# Patient Record
Sex: Female | Born: 1955 | Race: White | Hispanic: No | Marital: Single | Smoking: Never smoker
Health system: Southern US, Community
[De-identification: ages and names within clinical notes are randomized; demographics above are authoritative.]

## PROBLEM LIST (undated history)

## (undated) DIAGNOSIS — S069X9A Unspecified intracranial injury with loss of consciousness of unspecified duration, initial encounter: Secondary | ICD-10-CM

## (undated) DIAGNOSIS — E039 Hypothyroidism, unspecified: Secondary | ICD-10-CM

## (undated) DIAGNOSIS — Z9889 Other specified postprocedural states: Secondary | ICD-10-CM

## (undated) DIAGNOSIS — J45909 Unspecified asthma, uncomplicated: Secondary | ICD-10-CM

## (undated) DIAGNOSIS — S069XAA Unspecified intracranial injury with loss of consciousness status unknown, initial encounter: Secondary | ICD-10-CM

## (undated) DIAGNOSIS — S129XXA Fracture of neck, unspecified, initial encounter: Secondary | ICD-10-CM

## (undated) DIAGNOSIS — R112 Nausea with vomiting, unspecified: Secondary | ICD-10-CM

## (undated) HISTORY — PX: FOOT NEUROMA SURGERY: SHX646

## (undated) HISTORY — PX: OOPHORECTOMY: SHX86

## (undated) HISTORY — PX: FRACTURE SURGERY: SHX138

## (undated) HISTORY — PX: OTHER SURGICAL HISTORY: SHX169

---

## 2005-02-19 ENCOUNTER — Ambulatory Visit: Payer: Self-pay

## 2005-03-10 ENCOUNTER — Ambulatory Visit: Payer: Self-pay

## 2005-04-01 ENCOUNTER — Ambulatory Visit: Payer: Self-pay | Admitting: Obstetrics and Gynecology

## 2005-04-22 ENCOUNTER — Ambulatory Visit: Payer: Self-pay | Admitting: Psychiatry

## 2005-10-25 ENCOUNTER — Emergency Department: Payer: Self-pay | Admitting: Emergency Medicine

## 2006-04-20 ENCOUNTER — Ambulatory Visit: Payer: Self-pay | Admitting: Obstetrics and Gynecology

## 2007-01-07 ENCOUNTER — Ambulatory Visit: Payer: Self-pay | Admitting: Internal Medicine

## 2007-05-11 ENCOUNTER — Ambulatory Visit: Payer: Self-pay | Admitting: Obstetrics and Gynecology

## 2008-07-10 ENCOUNTER — Ambulatory Visit: Payer: Self-pay | Admitting: Internal Medicine

## 2008-07-14 ENCOUNTER — Ambulatory Visit: Payer: Self-pay | Admitting: Internal Medicine

## 2008-09-05 ENCOUNTER — Ambulatory Visit: Payer: Self-pay | Admitting: Internal Medicine

## 2009-01-18 ENCOUNTER — Ambulatory Visit: Payer: Self-pay | Admitting: Otolaryngology

## 2009-11-07 ENCOUNTER — Ambulatory Visit: Payer: Self-pay | Admitting: Internal Medicine

## 2010-01-31 ENCOUNTER — Emergency Department: Payer: Self-pay | Admitting: Emergency Medicine

## 2010-03-13 ENCOUNTER — Encounter: Payer: Self-pay | Admitting: Physical Medicine and Rehabilitation

## 2010-03-17 ENCOUNTER — Encounter: Payer: Self-pay | Admitting: Physical Medicine and Rehabilitation

## 2010-04-12 ENCOUNTER — Ambulatory Visit: Payer: Self-pay | Admitting: Physical Medicine and Rehabilitation

## 2010-04-17 ENCOUNTER — Encounter: Payer: Self-pay | Admitting: Physical Medicine and Rehabilitation

## 2010-11-22 ENCOUNTER — Ambulatory Visit: Payer: Self-pay | Admitting: Obstetrics and Gynecology

## 2011-11-24 ENCOUNTER — Ambulatory Visit: Payer: Self-pay | Admitting: Obstetrics and Gynecology

## 2012-04-08 ENCOUNTER — Ambulatory Visit: Payer: Self-pay | Admitting: Physical Medicine and Rehabilitation

## 2012-12-01 ENCOUNTER — Ambulatory Visit: Payer: Self-pay | Admitting: Internal Medicine

## 2013-04-27 ENCOUNTER — Ambulatory Visit: Payer: Self-pay | Admitting: Internal Medicine

## 2013-12-02 ENCOUNTER — Ambulatory Visit: Payer: Self-pay | Admitting: Internal Medicine

## 2014-06-08 DIAGNOSIS — E538 Deficiency of other specified B group vitamins: Secondary | ICD-10-CM | POA: Insufficient documentation

## 2014-06-08 DIAGNOSIS — F329 Major depressive disorder, single episode, unspecified: Secondary | ICD-10-CM | POA: Insufficient documentation

## 2014-06-08 DIAGNOSIS — K5190 Ulcerative colitis, unspecified, without complications: Secondary | ICD-10-CM | POA: Insufficient documentation

## 2014-06-08 DIAGNOSIS — F32A Depression, unspecified: Secondary | ICD-10-CM | POA: Insufficient documentation

## 2014-06-08 DIAGNOSIS — E039 Hypothyroidism, unspecified: Secondary | ICD-10-CM | POA: Insufficient documentation

## 2014-06-08 DIAGNOSIS — G43909 Migraine, unspecified, not intractable, without status migrainosus: Secondary | ICD-10-CM | POA: Insufficient documentation

## 2014-06-08 DIAGNOSIS — D649 Anemia, unspecified: Secondary | ICD-10-CM | POA: Insufficient documentation

## 2014-06-08 DIAGNOSIS — E559 Vitamin D deficiency, unspecified: Secondary | ICD-10-CM | POA: Insufficient documentation

## 2014-06-08 DIAGNOSIS — F419 Anxiety disorder, unspecified: Secondary | ICD-10-CM | POA: Insufficient documentation

## 2014-06-08 DIAGNOSIS — M5030 Other cervical disc degeneration, unspecified cervical region: Secondary | ICD-10-CM | POA: Insufficient documentation

## 2014-11-30 DIAGNOSIS — M810 Age-related osteoporosis without current pathological fracture: Secondary | ICD-10-CM | POA: Insufficient documentation

## 2014-12-27 DIAGNOSIS — M62838 Other muscle spasm: Secondary | ICD-10-CM | POA: Insufficient documentation

## 2014-12-27 DIAGNOSIS — M5417 Radiculopathy, lumbosacral region: Secondary | ICD-10-CM | POA: Insufficient documentation

## 2014-12-27 DIAGNOSIS — M51369 Other intervertebral disc degeneration, lumbar region without mention of lumbar back pain or lower extremity pain: Secondary | ICD-10-CM | POA: Insufficient documentation

## 2014-12-27 DIAGNOSIS — M5136 Other intervertebral disc degeneration, lumbar region: Secondary | ICD-10-CM | POA: Insufficient documentation

## 2015-03-21 ENCOUNTER — Other Ambulatory Visit: Payer: Self-pay

## 2015-03-21 DIAGNOSIS — Z1231 Encounter for screening mammogram for malignant neoplasm of breast: Secondary | ICD-10-CM

## 2015-04-05 ENCOUNTER — Ambulatory Visit: Payer: Self-pay

## 2015-04-20 ENCOUNTER — Ambulatory Visit
Admission: RE | Admit: 2015-04-20 | Discharge: 2015-04-20 | Disposition: A | Discharge status: Home / Self Care | Payer: Medicare PPO | Source: Ambulatory Visit | Attending: Physician Assistant | Admitting: Physician Assistant

## 2015-04-20 ENCOUNTER — Other Ambulatory Visit: Payer: Self-pay | Admitting: Physician Assistant

## 2015-04-20 DIAGNOSIS — R51 Headache: Principal | ICD-10-CM

## 2015-04-20 DIAGNOSIS — S0990XA Unspecified injury of head, initial encounter: Secondary | ICD-10-CM | POA: Insufficient documentation

## 2015-04-20 DIAGNOSIS — R519 Headache, unspecified: Secondary | ICD-10-CM

## 2015-05-14 ENCOUNTER — Ambulatory Visit: Payer: Medicare PPO

## 2015-05-16 ENCOUNTER — Other Ambulatory Visit: Payer: Self-pay | Admitting: Internal Medicine

## 2015-05-16 DIAGNOSIS — Z1231 Encounter for screening mammogram for malignant neoplasm of breast: Secondary | ICD-10-CM

## 2015-05-18 ENCOUNTER — Ambulatory Visit
Admission: RE | Admit: 2015-05-18 | Discharge: 2015-05-18 | Disposition: A | Discharge status: Home / Self Care | Payer: Medicare PPO | Source: Ambulatory Visit | Attending: Internal Medicine | Admitting: Internal Medicine

## 2015-05-18 ENCOUNTER — Other Ambulatory Visit: Payer: Self-pay | Admitting: Internal Medicine

## 2015-05-18 DIAGNOSIS — Z1231 Encounter for screening mammogram for malignant neoplasm of breast: Secondary | ICD-10-CM | POA: Diagnosis not present

## 2015-12-10 DIAGNOSIS — F3341 Major depressive disorder, recurrent, in partial remission: Secondary | ICD-10-CM | POA: Insufficient documentation

## 2016-05-07 ENCOUNTER — Other Ambulatory Visit: Payer: Self-pay | Admitting: Internal Medicine

## 2016-05-08 ENCOUNTER — Other Ambulatory Visit: Payer: Self-pay | Admitting: Internal Medicine

## 2016-05-08 ENCOUNTER — Other Ambulatory Visit: Payer: Self-pay | Admitting: Obstetrics and Gynecology

## 2016-05-08 DIAGNOSIS — Z1231 Encounter for screening mammogram for malignant neoplasm of breast: Secondary | ICD-10-CM

## 2016-05-26 ENCOUNTER — Ambulatory Visit: Payer: Medicare PPO

## 2016-06-16 ENCOUNTER — Other Ambulatory Visit: Payer: Self-pay | Admitting: Obstetrics and Gynecology

## 2016-06-16 ENCOUNTER — Ambulatory Visit
Admission: RE | Admit: 2016-06-16 | Discharge: 2016-06-16 | Disposition: A | Discharge status: Home / Self Care | Payer: Medicare Other | Source: Ambulatory Visit | Attending: Obstetrics and Gynecology | Admitting: Obstetrics and Gynecology

## 2016-06-16 DIAGNOSIS — Z1231 Encounter for screening mammogram for malignant neoplasm of breast: Secondary | ICD-10-CM

## 2016-12-30 ENCOUNTER — Telehealth: Payer: Self-pay | Admitting: Gastroenterology

## 2016-12-30 DIAGNOSIS — E785 Hyperlipidemia, unspecified: Secondary | ICD-10-CM | POA: Insufficient documentation

## 2016-12-30 NOTE — Telephone Encounter (Signed)
Left voice message for patient to call and schedule appointment with GI for ulcerative colitis referred by [facility].

## 2017-01-27 ENCOUNTER — Ambulatory Visit: Payer: Medicare PPO | Admitting: Gastroenterology

## 2017-01-28 ENCOUNTER — Emergency Department: Payer: Medicare Other

## 2017-01-28 ENCOUNTER — Encounter: Payer: Self-pay | Admitting: Emergency Medicine

## 2017-01-28 ENCOUNTER — Emergency Department
Admission: EM | Admit: 2017-01-28 | Discharge: 2017-01-28 | Disposition: A | Discharge status: Home / Self Care | Payer: Medicare Other | Attending: Emergency Medicine | Admitting: Emergency Medicine

## 2017-01-28 DIAGNOSIS — S060X0A Concussion without loss of consciousness, initial encounter: Secondary | ICD-10-CM | POA: Diagnosis not present

## 2017-01-28 DIAGNOSIS — S0990XA Unspecified injury of head, initial encounter: Secondary | ICD-10-CM | POA: Diagnosis present

## 2017-01-28 DIAGNOSIS — W1809XA Striking against other object with subsequent fall, initial encounter: Secondary | ICD-10-CM | POA: Insufficient documentation

## 2017-01-28 DIAGNOSIS — Y9301 Activity, walking, marching and hiking: Secondary | ICD-10-CM | POA: Insufficient documentation

## 2017-01-28 DIAGNOSIS — Y929 Unspecified place or not applicable: Secondary | ICD-10-CM | POA: Insufficient documentation

## 2017-01-28 DIAGNOSIS — Y999 Unspecified external cause status: Secondary | ICD-10-CM | POA: Diagnosis not present

## 2017-01-28 DIAGNOSIS — J45909 Unspecified asthma, uncomplicated: Secondary | ICD-10-CM | POA: Insufficient documentation

## 2017-01-28 HISTORY — DX: Fracture of neck, unspecified, initial encounter: S12.9XXA

## 2017-01-28 HISTORY — DX: Unspecified asthma, uncomplicated: J45.909

## 2017-01-28 HISTORY — DX: Unspecified intracranial injury with loss of consciousness status unknown, initial encounter: S06.9XAA

## 2017-01-28 HISTORY — DX: Unspecified intracranial injury with loss of consciousness of unspecified duration, initial encounter: S06.9X9A

## 2017-01-28 MED ORDER — ACETAMINOPHEN 325 MG PO TABS
650.0000 mg | ORAL_TABLET | Freq: Once | ORAL | Status: AC
  Administered 2017-01-28: 650 mg via ORAL
  Filled 2017-01-28: qty 2
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
## 2017-01-28 NOTE — ED Triage Notes (Signed)
Says fell night before last while walking her dogs and hit head. Says head and neck pain.

## 2017-02-19 ENCOUNTER — Ambulatory Visit: Payer: Medicare Other | Admitting: Gastroenterology

## 2017-02-19 ENCOUNTER — Other Ambulatory Visit: Payer: Self-pay

## 2017-03-23 ENCOUNTER — Ambulatory Visit: Payer: Medicare Other | Admitting: Gastroenterology

## 2017-04-23 ENCOUNTER — Ambulatory Visit (INDEPENDENT_AMBULATORY_CARE_PROVIDER_SITE_OTHER): Payer: Medicare Other | Admitting: Gastroenterology

## 2017-04-23 ENCOUNTER — Other Ambulatory Visit: Payer: Self-pay

## 2017-04-23 ENCOUNTER — Encounter: Payer: Self-pay | Admitting: Gastroenterology

## 2017-04-23 VITALS — BP 134/80 | HR 97 | Temp 98.2°F | Ht 63.0 in | Wt 154.0 lb

## 2017-04-23 DIAGNOSIS — K51819 Other ulcerative colitis with unspecified complications: Secondary | ICD-10-CM

## 2017-04-23 DIAGNOSIS — K51919 Ulcerative colitis, unspecified with unspecified complications: Secondary | ICD-10-CM
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
## 2017-04-24 ENCOUNTER — Telehealth: Payer: Self-pay | Admitting: Gastroenterology

## 2017-04-24 NOTE — Telephone Encounter (Signed)
04/24/17 [employer] website NO prior auth required for Colonoscopy [id number] / K51.90 Decision ID#: [id number].

## 2017-06-29 ENCOUNTER — Telehealth: Payer: Self-pay | Admitting: Gastroenterology

## 2017-06-29 ENCOUNTER — Encounter: Payer: Self-pay | Admitting: *Deleted

## 2017-06-29 NOTE — Telephone Encounter (Signed)
Patient left a voice message that she was out of town and forgot about her colonoscopy tomorrow. Please call her at [phone] to reschedule

## 2017-06-30 ENCOUNTER — Encounter: Admission: RE | Payer: Self-pay | Source: Ambulatory Visit

## 2017-06-30 ENCOUNTER — Ambulatory Visit: Admission: RE | Admit: 2017-06-30 | Payer: Medicare Other | Source: Ambulatory Visit | Admitting: Gastroenterology

## 2017-06-30 ENCOUNTER — Other Ambulatory Visit: Payer: Self-pay | Admitting: Internal Medicine

## 2017-06-30 DIAGNOSIS — Z1231 Encounter for screening mammogram for malignant neoplasm of breast: Secondary | ICD-10-CM

## 2017-06-30 SURGERY — COLONOSCOPY WITH PROPOFOL
Anesthesia: General

## 2017-06-30 NOTE — Telephone Encounter (Signed)
Left vm for pt to return my call to reschedule colonoscopy. Contacted [facility] to cancel procedure.

## 2017-08-17 ENCOUNTER — Ambulatory Visit
Admission: RE | Admit: 2017-08-17 | Discharge: 2017-08-17 | Disposition: A | Discharge status: Home / Self Care | Payer: Medicare Other | Source: Ambulatory Visit | Attending: Internal Medicine | Admitting: Internal Medicine

## 2017-08-17 DIAGNOSIS — Z1231 Encounter for screening mammogram for malignant neoplasm of breast: Secondary | ICD-10-CM | POA: Insufficient documentation

## 2017-08-26 ENCOUNTER — Telehealth: Payer: Self-pay | Admitting: Gastroenterology

## 2017-08-26 ENCOUNTER — Other Ambulatory Visit: Payer: Self-pay

## 2017-08-26 DIAGNOSIS — K51919 Ulcerative colitis, unspecified with unspecified complications: Secondary | ICD-10-CM

## 2017-08-26 NOTE — Telephone Encounter (Signed)
Pt has been scheduled for a colonoscopy at [facility] on 09/10/17. Instructs/rx mailed.

## 2017-08-26 NOTE — Telephone Encounter (Signed)
Patient called and would like a returned call today. She stated she wasn't called back after you called her last time.  If she doesn't answer at home please call her cell [phone]

## 2017-09-07 ENCOUNTER — Encounter: Payer: Self-pay | Admitting: *Deleted

## 2017-09-10 ENCOUNTER — Ambulatory Visit: Payer: Medicare Other | Admitting: Anesthesiology

## 2017-09-10 ENCOUNTER — Encounter
Admission: RE | Disposition: A | Discharge status: Home / Self Care | Payer: Self-pay | Source: Ambulatory Visit | Attending: Gastroenterology

## 2017-09-10 ENCOUNTER — Ambulatory Visit
Admission: RE | Admit: 2017-09-10 | Discharge: 2017-09-10 | Disposition: A | Discharge status: Home / Self Care | Payer: Medicare Other | Source: Ambulatory Visit | Attending: Gastroenterology | Admitting: Gastroenterology

## 2017-09-10 DIAGNOSIS — K5190 Ulcerative colitis, unspecified, without complications: Secondary | ICD-10-CM | POA: Insufficient documentation

## 2017-09-10 DIAGNOSIS — Z79899 Other long term (current) drug therapy: Secondary | ICD-10-CM | POA: Insufficient documentation

## 2017-09-10 DIAGNOSIS — K279 Peptic ulcer, site unspecified, unspecified as acute or chronic, without hemorrhage or perforation: Secondary | ICD-10-CM | POA: Insufficient documentation

## 2017-09-10 DIAGNOSIS — K51919 Ulcerative colitis, unspecified with unspecified complications: Secondary | ICD-10-CM

## 2017-09-10 DIAGNOSIS — J45909 Unspecified asthma, uncomplicated: Secondary | ICD-10-CM | POA: Diagnosis not present

## 2017-09-10 DIAGNOSIS — Z1211 Encounter for screening for malignant neoplasm of colon: Secondary | ICD-10-CM | POA: Diagnosis not present

## 2017-09-10 HISTORY — DX: Other specified postprocedural states: Z98.890

## 2017-09-10 HISTORY — PX: COLONOSCOPY WITH PROPOFOL: SHX5780

## 2017-09-10 HISTORY — DX: Nausea with vomiting, unspecified: R11.2

## 2017-09-10 SURGERY — COLONOSCOPY WITH PROPOFOL
Anesthesia: General | Wound class: Contaminated

## 2017-09-10 MED ORDER — LACTATED RINGERS IV SOLN
INTRAVENOUS | Status: DC
  Administered 2017-09-10: 08:00:00 via INTRAVENOUS

## 2017-09-10 MED ORDER — STERILE WATER FOR IRRIGATION IR SOLN
Status: DC | PRN
  Administered 2017-09-10: 08:00:00

## 2017-09-10 MED ORDER — PROPOFOL 10 MG/ML IV BOLUS
INTRAVENOUS | Status: DC | PRN
  Administered 2017-09-10: 20 mg via INTRAVENOUS
  Administered 2017-09-10: 80 mg via INTRAVENOUS
  Administered 2017-09-10 (×7): 20 mg via INTRAVENOUS

## 2017-09-10 MED ORDER — LIDOCAINE HCL (CARDIAC) 20 MG/ML IV SOLN
INTRAVENOUS | Status: DC | PRN
  Administered 2017-09-10: 50 mg via INTRAVENOUS

## 2017-09-10 MED ORDER — SODIUM CHLORIDE 0.9 % IV SOLN: INTRAVENOUS | Status: DC

## 2017-09-10 SURGICAL SUPPLY — 23 items

## 2017-09-10 NOTE — Transfer of Care (Signed)
Immediate Anesthesia Transfer of Care Note  Patient: Cristina Duncan  Procedure(s) Performed: COLONOSCOPY WITH PROPOFOL (N/A )  Patient Location: PACU  Anesthesia Type: General  Level of Consciousness: awake, alert  and patient cooperative  Airway and Oxygen Therapy: Patient Spontanous Breathing and Patient connected to supplemental oxygen  Post-op Assessment: Post-op Vital signs reviewed, Patient's Cardiovascular Status Stable, Respiratory Function Stable, Patent Airway and No signs of Nausea or vomiting  Post-op Vital Signs: Reviewed and stable  Complications: No apparent anesthesia complications
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
## 2017-09-10 NOTE — Anesthesia Procedure Notes (Signed)
Performed by: Mayme Genta Pre-anesthesia Checklist: Patient identified, Emergency Drugs available, Suction available, Timeout performed and Patient being monitored Patient Re-evaluated:Patient Re-evaluated prior to induction Oxygen Delivery Method: Nasal cannula Placement Confirmation: positive ETCO2

## 2017-09-10 NOTE — Op Note (Signed)
[facility] Patient Name: Cristina Duncan Procedure Date: 09/10/2017 8:16 AM MRN: [id number] Account #: [id number] Date of Birth: September 30, 1956 Admit Type: Outpatient Age: 61 Room: [facility] OR ROOM 01 Gender: Female Note Status: Finalized Procedure:            Colonoscopy Indications:          High risk colon cancer surveillance: Ulcerative colitis Providers:            Lucilla Lame MD, MD Referring MD:         Leonie Douglas. Doy Hutching, MD (Referring MD) Medicines:            Propofol per Anesthesia Complications:        No immediate complications. Procedure:            Pre-Anesthesia Assessment:                       - Prior to the procedure, a History and Physical was                        performed, and patient medications and allergies were                        reviewed. The patient's tolerance of previous                        anesthesia was also reviewed. The risks and benefits of                        the procedure and the sedation options and risks were                        discussed with the patient. All questions were                        answered, and informed consent was obtained. Prior                        Anticoagulants: The patient has taken no previous                        anticoagulant or antiplatelet agents. ASA Grade                        Assessment: II - A patient with mild systemic disease.                        After reviewing the risks and benefits, the patient was                        deemed in satisfactory condition to undergo the                        procedure.                       After obtaining informed consent, the colonoscope was                        passed under direct vision. Throughout the procedure,  the patient's blood pressure, pulse, and oxygen                        saturations were monitored continuously. The [place] [id number]) was  introduced through the                        anus and advanced to the the cecum, identified by                        appendiceal orifice and ileocecal valve. The                        colonoscopy was performed without difficulty. The                        patient tolerated the procedure well. The quality of                        the bowel preparation was excellent. Findings:      The perianal and digital rectal examinations were normal.      The entire examined colon appeared normal.      Four biopsies were taken every 10 cm with a cold forceps from the entire       colon for ulcerative colitis surveillance. These biopsy specimens from       the entire colon were sent to Pathology. Impression:           - The entire examined colon is normal.                       - Biopsies for surveillance were taken from the entire                        colon. Recommendation:       - Discharge patient to home.                       - Resume previous diet.                       - Continue present medications.                       - Await pathology results.                       - Repeat colonoscopy in 2 years for surveillance. Procedure Code(s):    --- Professional ---                       [phone], Colonoscopy, flexible; with biopsy, single or                        multiple Diagnosis Code(s):    --- Professional ---                       K51.90, Ulcerative colitis, unspecified, without  complications CPT copyright 2016 American Medical Association. All rights reserved. The codes documented in this report are preliminary and upon coder review may  be revised to meet current compliance requirements. Lucilla Lame MD, MD 09/10/2017 8:50:29 AM This report has been signed electronically. Number of Addenda: 0 Note Initiated On: 09/10/2017 8:16 AM Scope Withdrawal Time: 0 hours 10 minutes 0 seconds  Total Procedure Duration: 0 hours 17 minutes 52 seconds       [facility]

## 2017-09-10 NOTE — Discharge Instructions (Signed)
General Anesthesia, Adult, Care After °These instructions provide you with information about caring for yourself after your procedure. Your health care provider may also give you more specific instructions. Your treatment has been planned according to current medical practices, but problems sometimes occur. Call your health care provider if you have any problems or questions after your procedure. °What can I expect after the procedure? °After the procedure, it is common to have: °· Vomiting. °· A sore throat. °· Mental slowness. ° °It is common to feel: °· Nauseous. °· Cold or shivery. °· Sleepy. °· Tired. °· Sore or achy, even in parts of your body where you did not have surgery. ° °Follow these instructions at home: °For at least 24 hours after the procedure: °· Do not: °? Participate in activities where you could fall or become injured. °? Drive. °? Use heavy machinery. °? Drink alcohol. °? Take sleeping pills or medicines that cause drowsiness. °? Make important decisions or sign legal documents. °? Take care of children on your own. °· Rest. °Eating and drinking °· If you vomit, drink water, juice, or soup when you can drink without vomiting. °· Drink enough fluid to keep your urine clear or pale yellow. °· Make sure you have little or no nausea before eating solid foods. °· Follow the diet recommended by your health care provider. °General instructions °· Have a responsible adult stay with you until you are awake and alert. °· Return to your normal activities as told by your health care provider. Ask your health care provider what activities are safe for you. °· Take over-the-counter and prescription medicines only as told by your health care provider. °· If you smoke, do not smoke without supervision. °· Keep all follow-up visits as told by your health care provider. This is important. °Contact a health care provider if: °· You continue to have nausea or vomiting at home, and medicines are not helpful. °· You  cannot drink fluids or start eating again. °· You cannot urinate after 8-12 hours. °· You develop a skin rash. °· You have fever. °· You have increasing redness at the site of your procedure. °Get help right away if: °· You have difficulty breathing. °· You have chest pain. °· You have unexpected bleeding. °· You feel that you are having a life-threatening or urgent problem. °This information is not intended to replace advice given to you by your health care provider. Make sure you discuss any questions you have with your health care provider. °Document Released: 02/09/2001 Document Revised: 04/07/2016 Document Reviewed: 10/18/2015 °Elsevier Interactive Patient Education © 2018 [employer] ° °

## 2017-09-10 NOTE — Anesthesia Preprocedure Evaluation (Signed)
Anesthesia Evaluation  Patient identified by MRN, date of birth, ID band Patient awake    Reviewed: Allergy & Precautions, H&P , NPO status , Patient's Chart, lab work & pertinent test results  History of Anesthesia Complications (+) PONV and history of anesthetic complications  Airway Mallampati: II  TM Distance: >3 FB Neck ROM: full    Dental no notable dental hx.    Pulmonary asthma ,    Pulmonary exam normal        Cardiovascular Normal cardiovascular exam     Neuro/Psych    GI/Hepatic Neg liver ROS, PUD,   Endo/Other  negative endocrine ROS  Renal/GU negative Renal ROS     Musculoskeletal   Abdominal   Peds  Hematology negative hematology ROS (+)   Anesthesia Other Findings   Reproductive/Obstetrics                             Anesthesia Physical Anesthesia Plan  ASA: II  Anesthesia Plan: General   Post-op Pain Management:    Induction:   PONV Risk Score and Plan:   Airway Management Planned:   Additional Equipment:   Intra-op Plan:   Post-operative Plan:   Informed Consent: I have reviewed the patients History and Physical, chart, labs and discussed the procedure including the risks, benefits and alternatives for the proposed anesthesia with the patient or authorized representative who has indicated his/her understanding and acceptance.     Plan Discussed with:   Anesthesia Plan Comments:         Anesthesia Quick Evaluation

## 2017-09-10 NOTE — Anesthesia Postprocedure Evaluation (Signed)
Anesthesia Post Note  Patient: Cristina Duncan  Procedure(s) Performed: COLONOSCOPY WITH PROPOFOL (N/A )  Patient location during evaluation: PACU Anesthesia Type: General Level of consciousness: awake and alert Pain management: pain level controlled Vital Signs Assessment: post-procedure vital signs reviewed and stable Cardiovascular status: blood pressure returned to baseline Postop Assessment: no headache Anesthetic complications: no    Jaci Standard, III,  Maree Ainley D

## 2017-09-11 ENCOUNTER — Encounter: Payer: Self-pay | Admitting: Gastroenterology

## 2017-09-25 ENCOUNTER — Telehealth: Payer: Self-pay

## 2017-09-25 NOTE — Telephone Encounter (Signed)
Pt notified of colonoscopy results.

## 2017-09-25 NOTE — Telephone Encounter (Signed)
-----   Message from Lucilla Lame, MD sent at 09/14/2017  8:01 AM EDT ----- Let the patient know the biopsies showed inactive ulcerative colitis without any precancerous tissue. Next colonoscopy in 2 years.

## 2017-10-15 DIAGNOSIS — M159 Polyosteoarthritis, unspecified: Secondary | ICD-10-CM | POA: Insufficient documentation

## 2017-11-16 ENCOUNTER — Ambulatory Visit: Payer: Medicare Other | Admitting: Occupational Therapy

## 2017-11-18 ENCOUNTER — Ambulatory Visit: Payer: Medicare Other | Admitting: Occupational Therapy

## 2017-11-25 ENCOUNTER — Ambulatory Visit: Payer: Medicare Other | Admitting: Occupational Therapy

## 2017-11-27 ENCOUNTER — Other Ambulatory Visit: Payer: Self-pay | Admitting: Physical Medicine and Rehabilitation

## 2017-11-27 DIAGNOSIS — M5416 Radiculopathy, lumbar region: Secondary | ICD-10-CM

## 2017-12-03 ENCOUNTER — Other Ambulatory Visit: Payer: Self-pay | Admitting: Physical Medicine and Rehabilitation

## 2017-12-03 ENCOUNTER — Ambulatory Visit
Admission: RE | Admit: 2017-12-03 | Discharge: 2017-12-03 | Disposition: A | Discharge status: Home / Self Care | Payer: Medicare Other | Source: Ambulatory Visit | Attending: Physical Medicine and Rehabilitation | Admitting: Physical Medicine and Rehabilitation

## 2017-12-03 DIAGNOSIS — M5126 Other intervertebral disc displacement, lumbar region: Secondary | ICD-10-CM | POA: Insufficient documentation

## 2017-12-03 DIAGNOSIS — M5416 Radiculopathy, lumbar region: Secondary | ICD-10-CM

## 2017-12-03 DIAGNOSIS — M5136 Other intervertebral disc degeneration, lumbar region: Secondary | ICD-10-CM | POA: Insufficient documentation

## 2017-12-03 DIAGNOSIS — M48061 Spinal stenosis, lumbar region without neurogenic claudication: Secondary | ICD-10-CM | POA: Insufficient documentation

## 2017-12-08 ENCOUNTER — Telehealth: Payer: Self-pay | Admitting: Gastroenterology

## 2017-12-08 NOTE — Telephone Encounter (Signed)
Patient LVM for you to call her regarding stomach issues.

## 2017-12-10 NOTE — Telephone Encounter (Signed)
Spoke with pt and scheduled her for an office appt as she is still having issues with her stomach.

## 2017-12-21 ENCOUNTER — Other Ambulatory Visit: Payer: Self-pay | Admitting: Internal Medicine

## 2017-12-21 ENCOUNTER — Ambulatory Visit: Payer: Medicare Other | Admitting: Gastroenterology

## 2017-12-21 DIAGNOSIS — M5416 Radiculopathy, lumbar region: Secondary | ICD-10-CM

## 2018-01-12 ENCOUNTER — Ambulatory Visit: Admission: RE | Admit: 2018-01-12 | Payer: Medicare Other | Source: Ambulatory Visit

## 2018-01-12 ENCOUNTER — Other Ambulatory Visit: Payer: Self-pay | Admitting: Internal Medicine

## 2018-01-12 DIAGNOSIS — M5416 Radiculopathy, lumbar region: Secondary | ICD-10-CM

## 2018-08-17 ENCOUNTER — Other Ambulatory Visit: Payer: Self-pay | Admitting: Obstetrics and Gynecology

## 2018-08-17 DIAGNOSIS — Z1231 Encounter for screening mammogram for malignant neoplasm of breast: Secondary | ICD-10-CM

## 2018-08-31 ENCOUNTER — Ambulatory Visit
Admission: RE | Admit: 2018-08-31 | Discharge: 2018-08-31 | Disposition: A | Discharge status: Home / Self Care | Payer: Medicare Other | Source: Ambulatory Visit | Attending: Obstetrics and Gynecology | Admitting: Obstetrics and Gynecology

## 2018-08-31 DIAGNOSIS — Z1231 Encounter for screening mammogram for malignant neoplasm of breast: Secondary | ICD-10-CM | POA: Diagnosis not present

## 2019-01-26 ENCOUNTER — Other Ambulatory Visit: Payer: Self-pay | Admitting: Physician Assistant

## 2019-01-26 ENCOUNTER — Other Ambulatory Visit: Payer: Self-pay

## 2019-01-26 ENCOUNTER — Ambulatory Visit
Admission: RE | Admit: 2019-01-26 | Discharge: 2019-01-26 | Disposition: A | Discharge status: Home / Self Care | Payer: Medicare Other | Source: Ambulatory Visit | Attending: Physician Assistant | Admitting: Physician Assistant

## 2019-01-26 ENCOUNTER — Ambulatory Visit: Admission: RE | Admit: 2019-01-26 | Payer: Medicare Other | Source: Ambulatory Visit | Admitting: *Deleted

## 2019-01-26 DIAGNOSIS — M25562 Pain in left knee: Secondary | ICD-10-CM | POA: Diagnosis not present

## 2019-01-31 ENCOUNTER — Ambulatory Visit: Payer: Medicare Other

## 2019-07-22 ENCOUNTER — Other Ambulatory Visit: Payer: Self-pay | Admitting: Family Medicine

## 2019-07-22 ENCOUNTER — Other Ambulatory Visit: Payer: Self-pay | Admitting: Physical Medicine and Rehabilitation

## 2019-07-22 DIAGNOSIS — M5412 Radiculopathy, cervical region: Secondary | ICD-10-CM

## 2019-08-05 ENCOUNTER — Ambulatory Visit
Admission: RE | Admit: 2019-08-05 | Discharge: 2019-08-05 | Disposition: A | Discharge status: Home / Self Care | Payer: Medicare Other | Source: Ambulatory Visit | Attending: Physical Medicine and Rehabilitation | Admitting: Physical Medicine and Rehabilitation

## 2019-08-05 ENCOUNTER — Other Ambulatory Visit: Payer: Self-pay

## 2019-08-05 DIAGNOSIS — M5412 Radiculopathy, cervical region: Secondary | ICD-10-CM | POA: Diagnosis not present

## 2019-08-08 ENCOUNTER — Telehealth: Payer: Self-pay | Admitting: Gastroenterology

## 2019-08-08 NOTE — Telephone Encounter (Signed)
Pt left vm she received a recall letter to schedule a colonoscopy with Dr.Wohl

## 2019-08-10 NOTE — Telephone Encounter (Signed)
You are correct to use ulcerative colitis. Screenings are every 10 years and her last one was 2 years ago. That was his recommendation at the bottom of her last procedure.

## 2019-08-11 ENCOUNTER — Telehealth: Payer: Self-pay

## 2019-08-11 NOTE — Telephone Encounter (Signed)
Contacted patient in regards to scheduling her colonoscopy repeat with Dr. Allen Norris for Ulcerative Colitis.  Pt states that she has been experiencing  Bloating and Gassiness after her meals.  I advised her to avoid dairy products, leafy vegetables as these can contribute to her current symptoms.  She also states that she has noticed mucus in her stools recently. She is currently scheduled for 09/14/19 to see Dr. Allen Norris for constipation.  I informed her that I would discuss her current symptoms with Ginger to see if we can move her appt up to a sooner date, and call her back in the am.  She appreciated this very much, and will await for a call back.  Thanks [employer]

## 2019-08-25 ENCOUNTER — Other Ambulatory Visit: Payer: Self-pay

## 2019-08-25 ENCOUNTER — Ambulatory Visit (INDEPENDENT_AMBULATORY_CARE_PROVIDER_SITE_OTHER): Payer: Medicare Other | Admitting: Gastroenterology

## 2019-08-25 ENCOUNTER — Encounter: Payer: Self-pay | Admitting: Gastroenterology

## 2019-08-25 VITALS — BP 108/71 | HR 94 | Temp 98.1°F | Ht 63.0 in | Wt 143.8 lb

## 2019-08-25 DIAGNOSIS — K5904 Chronic idiopathic constipation: Secondary | ICD-10-CM | POA: Diagnosis not present

## 2019-08-25 DIAGNOSIS — K5180 Other ulcerative colitis without complications: Secondary | ICD-10-CM

## 2019-08-25 NOTE — Progress Notes (Signed)
Primary Care Physician: Idelle Crouch, MD  Primary Gastroenterologist:  Dr. Lucilla Lame  Chief Complaint  Patient presents with   Constipation    HPI: Cristina Duncan is a 63 y.o. female here with a history of ulcerative colitis.  The patient's last colonoscopy was 2 years ago with inactive chronic colitis seen.  Patient states that shortly after having a colonoscopy 2 years ago she started to develop constipation.  The patient has been constipated since then and reports that her stools are typically hard and she has been having trouble passing them.  She also states that she has been taking a large amount of Gas-X to try to help with the bloating and abdominal discomfort from her constipation.  There is no report of any unexplained weight loss fevers chills nausea or vomiting.  The patient also denies any black stools or bloody stools.  Current Outpatient Medications  Medication Sig Dispense Refill   AIMOVIG 140 MG/ML SOAJ      buPROPion (WELLBUTRIN XL) 150 MG 24 hr tablet      Cholecalciferol (VITAMIN D3) 5000 units TABS Take by mouth.     clonazePAM (KLONOPIN) 2 MG tablet TAKE 1 TABLET BY MOUTH 3 TIMES DAILY     cyanocobalamin (,VITAMIN B-12,) 1000 MCG/ML injection Inject into the muscle.     cyclobenzaprine (FLEXERIL) 5 MG tablet TAKE 1 TABLET 3 TIMES DAILY     diazepam (VALIUM) 5 MG tablet      DULoxetine (CYMBALTA) 30 MG capsule Take by mouth.     DULoxetine (CYMBALTA) 60 MG capsule Take by mouth.     estradiol (ESTRACE) 1 MG tablet TAKE 1 TABLET BY MOUTH DAILY     Fremanezumab-vfrm (AJOVY) 225 MG/1.5ML SOSY Inject into the skin.     gabapentin (NEURONTIN) 100 MG capsule      lansoprazole (PREVACID) 30 MG capsule      meloxicam (MOBIC) 7.5 MG tablet Take by mouth.     progesterone (PROMETRIUM) 200 MG capsule TAKE ONE CAPSULE BY MOUTH ONCE DAILY AT BEDTIME     RESTASIS MULTIDOSE 0.05 % ophthalmic emulsion      SUMAtriptan (IMITREX) 100 MG tablet       temazepam (RESTORIL) 15 MG capsule TAKE ONE CASPULE BY MOUTH AT BEDTIME AS NEEDED FOR SLEEP     tiZANidine (ZANAFLEX) 4 MG tablet      traMADol (ULTRAM) 50 MG tablet Take by mouth.     triamcinolone cream (KENALOG) 0.5 %      vitamin B-12 (CYANOCOBALAMIN) 1000 MCG tablet Take by mouth.     fluticasone (FLONASE) 50 MCG/ACT nasal spray Place into the nose.     levothyroxine (SYNTHROID, LEVOTHROID) 75 MCG tablet Take by mouth.     No current facility-administered medications for this visit.     Allergies as of 08/25/2019 - Review Complete 09/10/2017  Allergen Reaction Noted   Clindamycin hcl  05/01/2014   Oxycodone Itching 01/28/2017   Penicillin v potassium Itching 05/01/2014   Prednisone Hives 05/01/2014   Oxycodone-acetaminophen Itching and Rash 05/01/2014    ROS:  General: Negative for anorexia, weight loss, fever, chills, fatigue, weakness. ENT: Negative for hoarseness, difficulty swallowing , nasal congestion. CV: Negative for chest pain, angina, palpitations, dyspnea on exertion, peripheral edema.  Respiratory: Negative for dyspnea at rest, dyspnea on exertion, cough, sputum, wheezing.  GI: See history of present illness. GU:  Negative for dysuria, hematuria, urinary incontinence, urinary frequency, nocturnal urination.  Endo: Negative for unusual weight change.  Physical Examination:   BP 108/71    Pulse 94    Temp 98.1 F (36.7 C) (Temporal)    Ht 5' 3"  (1.6 m)    Wt 143 lb 12.8 oz (65.2 kg)    BMI 25.47 kg/m   General: Well-nourished, well-developed in no acute distress.  Eyes: No icterus. Conjunctivae pink. Mouth: Oropharyngeal mucosa moist and pink , no lesions erythema or exudate. Lungs: Clear to auscultation bilaterally. Non-labored. Heart: Regular rate and rhythm, no murmurs rubs or gallops.  Abdomen: Bowel sounds are normal, nontender, nondistended, no hepatosplenomegaly or masses, no abdominal bruits or hernia , no rebound or guarding.     Extremities: No lower extremity edema. No clubbing or deformities. Neuro: Alert and oriented x 3.  Grossly intact. Skin: Warm and dry, no jaundice.   Psych: Alert and cooperative, normal mood and affect.  Labs:    Imaging Studies: Mr Cervical Spine Wo Contrast  Result Date: 08/05/2019 CLINICAL DATA:  Right-sided neck pain and right shoulder pain with pain down the right arm since a fall in July 2020. EXAM: MRI CERVICAL SPINE WITHOUT CONTRAST TECHNIQUE: Multiplanar, multisequence MR imaging of the cervical spine was performed. No intravenous contrast was administered. COMPARISON:  MRI dated 03/27/2010 and CT scan of the cervical spine dated 01/28/2017 FINDINGS: Alignment: Chronic slight anterolisthesis of C4 on C5, 2 mm. Reversal of the midcervical lordosis, chronic. Vertebrae: No acute abnormalities. Chronic deformity of the left lateral mass of C4 due to facet arthritis. Cord: Normal signal and morphology. Posterior Fossa, vertebral arteries, paraspinal tissues: Negative. Disc levels: C2-3: No significant abnormality. C3-4: Tiny disc bulge central and to the left with accompanying osteophytes. Severe left facet arthritis. Moderate to severe left foraminal stenosis. C4-5: 2 mm anterolisthesis. Disc space narrowing. Small broad-based disc osteophyte complex. Moderately severe left facet arthritis. No foraminal stenosis. C5-6: Chronic disc space narrowing. Small broad-based endplate osteophytes with severe right foraminal stenosis. Slight left foraminal stenosis. C6-7: Disc space narrowing. Broad-based endplate osteophytes narrow the AP dimension of the spinal canal without spinal cord compression. Moderate to severe bilateral foraminal stenosis. C7-T1: Negative. IMPRESSION: 1. 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 2. Moderately severe left facet arthritis at C4-5. 3. Severe right foraminal stenosis at C5-6. 4. Moderate to severe bilateral foraminal stenosis  at C6-7. Electronically Signed   By: Lorriane Shire M.D.   On: 08/05/2019 15:04    Assessment and Plan:   Cristina Duncan is a 63 y.o. y/o female who comes in today for bloating abdominal pain and constipation.  The patient does have a history of ulcerative colitis and is in need of a colonoscopy for surveillance.  The patient takes Dulcolax when she gets very constipated.  The patient has been told to start MiraLAX every day and titrated to a bowel movement every 1 to 2 days.  She has been told that if it does not improve she should add Citrucel.  If that does not work to make her bowels more regular the patient has been told to contact my office and she may need to be started on Linzess.  The patient has also been told that there may be an overlap between her IBd and having IBS.  The patient has been explained the plan and agrees with it.  This visit consisted of 25 minutes face to face contact with myself and at least 50% of this time was spent in counseling and education regarding diagnosis, treatment options, medication management,  risks and benefits of treatment.   Lucilla Lame, MD. Marval Regal    Note: This dictation was prepared with Dragon dictation along with smaller phrase technology. Any transcriptional errors that result from this process are unintentional.

## 2019-08-26 ENCOUNTER — Other Ambulatory Visit: Payer: Self-pay

## 2019-08-26 DIAGNOSIS — K5180 Other ulcerative colitis without complications: Secondary | ICD-10-CM

## 2019-08-26 DIAGNOSIS — K5904 Chronic idiopathic constipation: Secondary | ICD-10-CM

## 2019-08-29 ENCOUNTER — Other Ambulatory Visit: Payer: Self-pay

## 2019-08-29 ENCOUNTER — Encounter: Payer: Self-pay | Admitting: *Deleted

## 2019-08-29 ENCOUNTER — Other Ambulatory Visit: Admission: RE | Admit: 2019-08-29 | Payer: Medicare Other | Source: Ambulatory Visit

## 2019-09-06 ENCOUNTER — Other Ambulatory Visit: Payer: Self-pay

## 2019-09-06 ENCOUNTER — Other Ambulatory Visit
Admission: RE | Admit: 2019-09-06 | Discharge: 2019-09-06 | Disposition: A | Discharge status: Home / Self Care | Payer: Medicare Other | Source: Ambulatory Visit | Attending: Gastroenterology | Admitting: Gastroenterology

## 2019-09-06 DIAGNOSIS — Z20828 Contact with and (suspected) exposure to other viral communicable diseases: Secondary | ICD-10-CM | POA: Insufficient documentation

## 2019-09-06 DIAGNOSIS — Z01812 Encounter for preprocedural laboratory examination: Secondary | ICD-10-CM | POA: Diagnosis present

## 2019-09-06 LAB — SARS CORONAVIRUS 2 (TAT 6-24 HRS): SARS Coronavirus 2: NEGATIVE
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
## 2019-09-08 NOTE — Discharge Instructions (Signed)

## 2019-09-09 ENCOUNTER — Ambulatory Visit: Payer: Medicare Other | Admitting: Anesthesiology

## 2019-09-09 ENCOUNTER — Other Ambulatory Visit: Payer: Self-pay

## 2019-09-09 ENCOUNTER — Encounter
Admission: RE | Disposition: A | Discharge status: Home / Self Care | Payer: Self-pay | Source: Home / Self Care | Attending: Gastroenterology

## 2019-09-09 ENCOUNTER — Ambulatory Visit
Admission: RE | Admit: 2019-09-09 | Discharge: 2019-09-09 | Disposition: A | Discharge status: Home / Self Care | Payer: Medicare Other | Attending: Gastroenterology | Admitting: Gastroenterology

## 2019-09-09 DIAGNOSIS — Z8719 Personal history of other diseases of the digestive system: Secondary | ICD-10-CM | POA: Diagnosis not present

## 2019-09-09 DIAGNOSIS — F329 Major depressive disorder, single episode, unspecified: Secondary | ICD-10-CM | POA: Insufficient documentation

## 2019-09-09 DIAGNOSIS — K5904 Chronic idiopathic constipation: Secondary | ICD-10-CM

## 2019-09-09 DIAGNOSIS — K5100 Ulcerative (chronic) pancolitis without complications: Secondary | ICD-10-CM | POA: Diagnosis not present

## 2019-09-09 DIAGNOSIS — E039 Hypothyroidism, unspecified: Secondary | ICD-10-CM | POA: Diagnosis not present

## 2019-09-09 DIAGNOSIS — Z1211 Encounter for screening for malignant neoplasm of colon: Secondary | ICD-10-CM | POA: Diagnosis not present

## 2019-09-09 DIAGNOSIS — Z885 Allergy status to narcotic agent status: Secondary | ICD-10-CM | POA: Insufficient documentation

## 2019-09-09 DIAGNOSIS — Z8711 Personal history of peptic ulcer disease: Secondary | ICD-10-CM | POA: Insufficient documentation

## 2019-09-09 DIAGNOSIS — K5180 Other ulcerative colitis without complications: Secondary | ICD-10-CM

## 2019-09-09 DIAGNOSIS — F419 Anxiety disorder, unspecified: Secondary | ICD-10-CM | POA: Insufficient documentation

## 2019-09-09 DIAGNOSIS — Z803 Family history of malignant neoplasm of breast: Secondary | ICD-10-CM | POA: Insufficient documentation

## 2019-09-09 DIAGNOSIS — Z79899 Other long term (current) drug therapy: Secondary | ICD-10-CM | POA: Insufficient documentation

## 2019-09-09 DIAGNOSIS — R519 Headache, unspecified: Secondary | ICD-10-CM | POA: Diagnosis not present

## 2019-09-09 DIAGNOSIS — Z880 Allergy status to penicillin: Secondary | ICD-10-CM | POA: Insufficient documentation

## 2019-09-09 DIAGNOSIS — Z87820 Personal history of traumatic brain injury: Secondary | ICD-10-CM | POA: Diagnosis not present

## 2019-09-09 DIAGNOSIS — J45909 Unspecified asthma, uncomplicated: Secondary | ICD-10-CM | POA: Diagnosis not present

## 2019-09-09 DIAGNOSIS — Z90721 Acquired absence of ovaries, unilateral: Secondary | ICD-10-CM | POA: Insufficient documentation

## 2019-09-09 DIAGNOSIS — Z881 Allergy status to other antibiotic agents status: Secondary | ICD-10-CM | POA: Diagnosis not present

## 2019-09-09 HISTORY — PX: COLONOSCOPY WITH PROPOFOL: SHX5780

## 2019-09-09 SURGERY — COLONOSCOPY WITH PROPOFOL
Anesthesia: General | Site: Rectum

## 2019-09-09 MED ORDER — STERILE WATER FOR IRRIGATION IR SOLN
Status: DC | PRN
  Administered 2019-09-09: 08:00:00 .05 mL

## 2019-09-09 MED ORDER — LACTATED RINGERS IV SOLN
INTRAVENOUS | Status: DC
  Administered 2019-09-09: 08:00:00 via INTRAVENOUS

## 2019-09-09 MED ORDER — SODIUM CHLORIDE 0.9 % IV SOLN: INTRAVENOUS | Status: DC

## 2019-09-09 MED ORDER — LIDOCAINE HCL (CARDIAC) PF 100 MG/5ML IV SOSY
PREFILLED_SYRINGE | INTRAVENOUS | Status: DC | PRN
  Administered 2019-09-09: 50 mg via INTRAVENOUS

## 2019-09-09 MED ORDER — ONDANSETRON HCL 4 MG/2ML IJ SOLN: 4.0000 mg | Freq: Once | INTRAMUSCULAR | Status: DC | PRN

## 2019-09-09 MED ORDER — PROPOFOL 10 MG/ML IV BOLUS
INTRAVENOUS | Status: DC | PRN
  Administered 2019-09-09: 10 mg via INTRAVENOUS
  Administered 2019-09-09 (×3): 20 mg via INTRAVENOUS
  Administered 2019-09-09: 70 mg via INTRAVENOUS
  Administered 2019-09-09: 10 mg via INTRAVENOUS
  Administered 2019-09-09: 20 mg via INTRAVENOUS
  Administered 2019-09-09: 10 mg via INTRAVENOUS
  Administered 2019-09-09: 20 mg via INTRAVENOUS

## 2019-09-09 MED ORDER — ACETAMINOPHEN 160 MG/5ML PO SOLN: 325.0000 mg | ORAL | Status: DC | PRN

## 2019-09-09 MED ORDER — ACETAMINOPHEN 325 MG PO TABS: 650.0000 mg | ORAL_TABLET | Freq: Once | ORAL | Status: DC | PRN

## 2019-09-09 SURGICAL SUPPLY — 6 items
CANISTER SUCT 1200ML W/VALVE (MISCELLANEOUS) ×2 IMPLANT
FORCEPS BIOP RAD 4 LRG CAP 4 (CUTTING FORCEPS) ×1 IMPLANT
GOWN CVR UNV OPN BCK APRN NK (MISCELLANEOUS) ×2 IMPLANT
GOWN ISOL THUMB LOOP REG UNIV (MISCELLANEOUS) ×2
KIT ENDO PROCEDURE OLY (KITS) ×2 IMPLANT
WATER STERILE IRR 250ML POUR (IV SOLUTION) ×2 IMPLANT

## 2019-09-09 NOTE — Op Note (Signed)
[facility] Gastroenterology Patient Name: Cristina Duncan Procedure Date: 09/09/2019 7:37 AM MRN: [id number] Account #: [id number] Date of Birth: 10-20-1956 Admit Type: Outpatient Age: 63 Room: [facility] OR ROOM 01 Gender: Female Note Status: Finalized Procedure:            Colonoscopy Indications:          High risk colon cancer surveillance: Ulcerative                        pancolitis of 8 (or more) years duration Providers:            Lucilla Lame MD, MD Referring MD:         Leonie Douglas. Doy Hutching, MD (Referring MD) Medicines:            Propofol per Anesthesia Complications:        No immediate complications. Procedure:            Pre-Anesthesia Assessment:                       - Prior to the procedure, a History and Physical was                        performed, and patient medications and allergies were                        reviewed. The patient's tolerance of previous                        anesthesia was also reviewed. The risks and benefits of                        the procedure and the sedation options and risks were                        discussed with the patient. All questions were                        answered, and informed consent was obtained. Prior                        Anticoagulants: The patient has taken no previous                        anticoagulant or antiplatelet agents. ASA Grade                        Assessment: II - A patient with mild systemic disease.                        After reviewing the risks and benefits, the patient was                        deemed in satisfactory condition to undergo the                        procedure.                       After obtaining informed consent, the colonoscope was  passed under direct vision. Throughout the procedure,                        the patient's blood pressure, pulse, and oxygen                        saturations were monitored continuously. The was              introduced through the anus and advanced to the the                        cecum, identified by appendiceal orifice and ileocecal                        valve. The colonoscopy was performed without                        difficulty. The patient tolerated the procedure well.                        The quality of the bowel preparation was excellent. Findings:      The perianal and digital rectal examinations were normal.      The colon (entire examined portion) appeared normal. Four biopsies were       taken every 10 cm with a cold forceps from the entire colon for       ulcerative colitis surveillance. These biopsy specimens were sent to       Pathology. Impression:           - The entire examined colon is normal. Biopsied. Recommendation:       - Discharge patient to home.                       - Resume previous diet.                       - Continue present medications.                       - Repeat colonoscopy in 2 years for surveillance. Procedure Code(s):    --- Professional ---                       [id number], Colonoscopy, flexible; with biopsy, single or                        multiple Diagnosis Code(s):    --- Professional ---                       K51.00, Ulcerative (chronic) pancolitis without                        complications CPT copyright 2019 American Medical Association. All rights reserved. The codes documented in this report are preliminary and upon coder review may  be revised to meet current compliance requirements. Lucilla Lame MD, MD 09/09/2019 8:29:50 AM This report has been signed electronically. Number of Addenda: 0 Note Initiated On: 09/09/2019 7:37 AM Scope Withdrawal Time: 0 hours 15 minutes 55 seconds  Total Procedure Duration: 0 hours 22 minutes 39 seconds  Estimated Blood Loss: Estimated blood loss: none.      [facility]

## 2019-09-09 NOTE — Anesthesia Procedure Notes (Signed)
Performed by: Chasen Mendell, CRNA Pre-anesthesia Checklist: Patient identified, Emergency Drugs available, Suction available, Timeout performed and Patient being monitored Patient Re-evaluated:Patient Re-evaluated prior to induction Oxygen Delivery Method: Nasal cannula Placement Confirmation: positive ETCO2       

## 2019-09-09 NOTE — Transfer of Care (Signed)
Immediate Anesthesia Transfer of Care Note  Patient: Cristina Duncan  Procedure(s) Performed: COLONOSCOPY WITH PROPOFOL (N/A Rectum)  Patient Location: PACU  Anesthesia Type: General  Level of Consciousness: awake, alert  and patient cooperative  Airway and Oxygen Therapy: Patient Spontanous Breathing and Patient connected to supplemental oxygen  Post-op Assessment: Post-op Vital signs reviewed, Patient's Cardiovascular Status Stable, Respiratory Function Stable, Patent Airway and No signs of Nausea or vomiting  Post-op Vital Signs: Reviewed and stable  Complications: No apparent anesthesia complications
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
## 2019-09-09 NOTE — Anesthesia Postprocedure Evaluation (Signed)
Anesthesia Post Note  Patient: Cristina Duncan  Procedure(s) Performed: COLONOSCOPY WITH PROPOFOL (N/A Rectum)  Patient location during evaluation: PACU Anesthesia Type: General Level of consciousness: awake and alert, oriented and patient cooperative Pain management: pain level controlled Vital Signs Assessment: post-procedure vital signs reviewed and stable Respiratory status: spontaneous breathing, nonlabored ventilation and respiratory function stable Cardiovascular status: blood pressure returned to baseline and stable Postop Assessment: adequate PO intake Anesthetic complications: no    Darrin Nipper

## 2019-09-12 ENCOUNTER — Encounter: Payer: Self-pay | Admitting: Gastroenterology

## 2019-09-13 ENCOUNTER — Encounter: Payer: Self-pay | Admitting: Gastroenterology

## 2019-09-14 ENCOUNTER — Other Ambulatory Visit: Payer: Self-pay

## 2019-09-14 ENCOUNTER — Ambulatory Visit: Payer: Medicare Other | Admitting: Gastroenterology

## 2019-09-14 ENCOUNTER — Telehealth: Payer: Self-pay

## 2019-09-14 NOTE — Telephone Encounter (Signed)
Pt notified of colonoscopy results.

## 2019-09-14 NOTE — Telephone Encounter (Signed)
-----   Message from Lucilla Lame, MD sent at 09/14/2019  8:09 AM EDT ----- Let the patient know that the biopsies did not show any sign of inflammation or cancerous/precancerous lesions.  Repeat colonoscopy in 2 years.

## 2019-10-14 ENCOUNTER — Other Ambulatory Visit: Payer: Self-pay | Admitting: Internal Medicine

## 2019-10-17 ENCOUNTER — Other Ambulatory Visit: Payer: Self-pay | Admitting: Internal Medicine

## 2019-10-17 DIAGNOSIS — R1031 Right lower quadrant pain: Secondary | ICD-10-CM

## 2019-10-17 DIAGNOSIS — Z1231 Encounter for screening mammogram for malignant neoplasm of breast: Secondary | ICD-10-CM

## 2019-10-17 DIAGNOSIS — Z23 Encounter for immunization: Secondary | ICD-10-CM

## 2019-10-21 ENCOUNTER — Telehealth: Payer: Self-pay | Admitting: Internal Medicine

## 2019-10-21 NOTE — Telephone Encounter (Signed)
10/21/19~Patient notified of test cancelled due to Order Clarification, per [facility]. Patient stated that she is having same pain in Right Ovary area as to pain in Left Ovary area as she did in the past.  To be rschd after New Order rcvd / clarified. Ref office now closed.

## 2019-10-24 ENCOUNTER — Ambulatory Visit: Payer: Medicare Other

## 2019-12-29 ENCOUNTER — Other Ambulatory Visit: Payer: Self-pay | Admitting: Neurology

## 2019-12-29 DIAGNOSIS — R4182 Altered mental status, unspecified: Secondary | ICD-10-CM

## 2020-01-04 ENCOUNTER — Ambulatory Visit
Admission: RE | Admit: 2020-01-04 | Discharge: 2020-01-04 | Disposition: A | Discharge status: Home / Self Care | Payer: Medicare PPO | Source: Ambulatory Visit | Attending: Internal Medicine | Admitting: Internal Medicine

## 2020-01-04 DIAGNOSIS — Z1231 Encounter for screening mammogram for malignant neoplasm of breast: Secondary | ICD-10-CM | POA: Diagnosis not present

## 2020-01-09 ENCOUNTER — Ambulatory Visit
Admission: RE | Admit: 2020-01-09 | Discharge: 2020-01-09 | Disposition: A | Discharge status: Home / Self Care | Payer: Medicare PPO | Source: Ambulatory Visit | Attending: Neurology | Admitting: Neurology

## 2020-01-09 ENCOUNTER — Other Ambulatory Visit: Payer: Self-pay

## 2020-01-09 DIAGNOSIS — R4182 Altered mental status, unspecified: Secondary | ICD-10-CM

## 2020-01-09 LAB — POCT I-STAT CREATININE: Creatinine, Ser: 0.9 mg/dL (ref 0.44–1.00)

## 2020-01-09 MED ORDER — GADOBUTROL 1 MMOL/ML IV SOLN
6.0000 mL | Freq: Once | INTRAVENOUS | Status: AC | PRN
  Administered 2020-01-09: 6 mL via INTRAVENOUS

## 2020-02-21 ENCOUNTER — Encounter: Payer: Self-pay | Admitting: Gastroenterology

## 2020-02-21 ENCOUNTER — Other Ambulatory Visit: Payer: Self-pay

## 2020-02-21 ENCOUNTER — Ambulatory Visit: Payer: Medicare PPO | Admitting: Gastroenterology

## 2020-02-21 VITALS — BP 115/76 | HR 105 | Temp 98.1°F | Ht 63.0 in | Wt 134.6 lb

## 2020-02-21 DIAGNOSIS — K5900 Constipation, unspecified: Secondary | ICD-10-CM

## 2020-02-21 NOTE — Progress Notes (Signed)
Primary Care Physician: Idelle Crouch, MD  Primary Gastroenterologist:  Dr. Lucilla Lame  Chief Complaint  Patient presents with  . Follow up constipation    HPI: Cristina Duncan is a 64 y.o. female here with a history of constipation.  The patient reports that her constipation has been an ongoing problem.  The patient does not have any worry symptoms such as black stools or bloody stools.  She also denies any unexplained weight loss fevers chills nausea or vomiting.  The patient has a history of ulcerative colitis and her last colonoscopy was 3 years ago which showed inactive colitis.  She started develop constipation shortly after her colonoscopy 3 years ago.  Past Medical History:  Diagnosis Date  . Asthma   . Neck fracture ([place])   . PONV (postoperative nausea and vomiting)    also, itching  . TBI (traumatic brain injury) [facility])     Current Outpatient Medications  Medication Sig Dispense Refill  . AIMOVIG 140 MG/ML SOAJ     . buPROPion (WELLBUTRIN XL) 150 MG 24 hr tablet     . Cholecalciferol (VITAMIN D3) 5000 units TABS Take by mouth.    . clonazePAM (KLONOPIN) 2 MG tablet TAKE 1 TABLET BY MOUTH 3 TIMES DAILY    . cyanocobalamin (,VITAMIN B-12,) 1000 MCG/ML injection Inject into the muscle.    . cyclobenzaprine (FLEXERIL) 5 MG tablet TAKE 1 TABLET 3 TIMES DAILY    . diazepam (VALIUM) 5 MG tablet     . DULoxetine (CYMBALTA) 30 MG capsule Take by mouth.    . DULoxetine (CYMBALTA) 60 MG capsule Take by mouth.    . estradiol (ESTRACE) 1 MG tablet TAKE 1 TABLET BY MOUTH DAILY    . fluticasone (FLONASE) 50 MCG/ACT nasal spray Place into the nose.    . Fremanezumab-vfrm (AJOVY) 225 MG/1.5ML SOSY Inject into the skin.    Marland Kitchen gabapentin (NEURONTIN) 100 MG capsule     . lansoprazole (PREVACID) 30 MG capsule     . levothyroxine (SYNTHROID, LEVOTHROID) 75 MCG tablet Take by mouth.    . meloxicam (MOBIC) 7.5 MG tablet Take by mouth.    . progesterone (PROMETRIUM) 200 MG capsule TAKE  ONE CAPSULE BY MOUTH ONCE DAILY AT BEDTIME    . RESTASIS MULTIDOSE 0.05 % ophthalmic emulsion     . SUMAtriptan (IMITREX) 100 MG tablet     . temazepam (RESTORIL) 15 MG capsule TAKE ONE CASPULE BY MOUTH AT BEDTIME AS NEEDED FOR SLEEP    . tiZANidine (ZANAFLEX) 4 MG tablet     . traMADol (ULTRAM) 50 MG tablet Take by mouth.    . triamcinolone cream (KENALOG) 0.5 %     . vitamin B-12 (CYANOCOBALAMIN) 1000 MCG tablet Take by mouth.     No current facility-administered medications for this visit.    Allergies as of 02/21/2020 - Review Complete 02/21/2020  Allergen Reaction Noted  . Clindamycin hcl  05/01/2014  . Oxycodone Itching 01/28/2017  . Penicillin v potassium Itching 05/01/2014  . Prednisone Hives 05/01/2014  . Oxycodone-acetaminophen Itching and Rash 05/01/2014    ROS:  General: Negative for anorexia, weight loss, fever, chills, fatigue, weakness. ENT: Negative for hoarseness, difficulty swallowing , nasal congestion. CV: Negative for chest pain, angina, palpitations, dyspnea on exertion, peripheral edema.  Respiratory: Negative for dyspnea at rest, dyspnea on exertion, cough, sputum, wheezing.  GI: See history of present illness. GU:  Negative for dysuria, hematuria, urinary incontinence, urinary frequency, nocturnal urination.  Endo: Negative  for unusual weight change.    Physical Examination:   There were no vitals taken for this visit.  General: Well-nourished, well-developed in no acute distress.  Eyes: No icterus. Conjunctivae pink. Lungs: Clear to auscultation bilaterally. Non-labored. Heart: Regular rate and rhythm, no murmurs rubs or gallops.  Abdomen: Bowel sounds are normal, nontender, nondistended, no hepatosplenomegaly or masses, no abdominal bruits or hernia , no rebound or guarding.   Extremities: No lower extremity edema. No clubbing or deformities. Neuro: Alert and oriented x 3.  Grossly intact. Skin: Warm and dry, no jaundice.   Psych: Alert and  cooperative, normal mood and affect.  Labs:    Imaging Studies: No results found.  Assessment and Plan:   Cristina Duncan is a 64 y.o. y/o female who comes in with chronic constipation.  The patient has not had any relief from the constipation.  The patient will be started on a trial of samples of Linzess.  The patient will contact us if the symptoms improved with the Linzess and then she will be given a prescription.  The patient has been explained the plan and agrees with it.     Lucilla Lame, MD. Marval Regal    Note: This dictation was prepared with Dragon dictation along with smaller phrase technology. Any transcriptional errors that result from this process are unintentional.

## 2020-02-29 ENCOUNTER — Ambulatory Visit: Payer: Medicare PPO | Admitting: Gastroenterology

## 2020-07-02 ENCOUNTER — Other Ambulatory Visit: Payer: Self-pay | Admitting: Gastroenterology

## 2020-07-02 DIAGNOSIS — R1031 Right lower quadrant pain: Secondary | ICD-10-CM

## 2020-07-02 DIAGNOSIS — R198 Other specified symptoms and signs involving the digestive system and abdomen: Secondary | ICD-10-CM

## 2020-07-02 DIAGNOSIS — R140 Abdominal distension (gaseous): Secondary | ICD-10-CM

## 2020-07-02 DIAGNOSIS — G8929 Other chronic pain: Secondary | ICD-10-CM

## 2020-07-10 ENCOUNTER — Other Ambulatory Visit: Payer: Self-pay

## 2020-07-10 ENCOUNTER — Ambulatory Visit
Admission: RE | Admit: 2020-07-10 | Discharge: 2020-07-10 | Disposition: A | Discharge status: Home / Self Care | Payer: Medicare PPO | Source: Ambulatory Visit | Attending: Gastroenterology | Admitting: Gastroenterology

## 2020-07-10 DIAGNOSIS — G8929 Other chronic pain: Secondary | ICD-10-CM | POA: Diagnosis present

## 2020-07-10 DIAGNOSIS — R140 Abdominal distension (gaseous): Secondary | ICD-10-CM

## 2020-07-10 DIAGNOSIS — R198 Other specified symptoms and signs involving the digestive system and abdomen: Secondary | ICD-10-CM | POA: Diagnosis present

## 2020-07-10 DIAGNOSIS — R1031 Right lower quadrant pain: Secondary | ICD-10-CM | POA: Diagnosis not present

## 2020-07-10 LAB — POCT I-STAT CREATININE: Creatinine, Ser: 1 mg/dL (ref 0.44–1.00)

## 2020-07-10 MED ORDER — IOHEXOL 300 MG/ML  SOLN
85.0000 mL | Freq: Once | INTRAMUSCULAR | Status: AC | PRN
  Administered 2020-07-10: 85 mL via INTRAVENOUS

## 2020-08-24 ENCOUNTER — Other Ambulatory Visit: Payer: Self-pay | Admitting: Internal Medicine

## 2020-11-06 ENCOUNTER — Other Ambulatory Visit (HOSPITAL_COMMUNITY): Payer: Self-pay | Admitting: Internal Medicine

## 2020-11-06 ENCOUNTER — Other Ambulatory Visit: Payer: Self-pay | Admitting: Internal Medicine

## 2020-11-06 DIAGNOSIS — E039 Hypothyroidism, unspecified: Secondary | ICD-10-CM

## 2020-11-06 DIAGNOSIS — R091 Pleurisy: Secondary | ICD-10-CM

## 2020-11-13 ENCOUNTER — Ambulatory Visit: Payer: Medicare PPO | Attending: Internal Medicine

## 2020-11-28 ENCOUNTER — Other Ambulatory Visit: Payer: Self-pay

## 2020-11-28 ENCOUNTER — Ambulatory Visit
Admission: RE | Admit: 2020-11-28 | Discharge: 2020-11-28 | Disposition: A | Discharge status: Home / Self Care | Payer: Medicare PPO | Source: Ambulatory Visit | Attending: Internal Medicine | Admitting: Internal Medicine

## 2020-11-28 DIAGNOSIS — E039 Hypothyroidism, unspecified: Secondary | ICD-10-CM

## 2020-11-28 DIAGNOSIS — R091 Pleurisy: Secondary | ICD-10-CM | POA: Insufficient documentation

## 2021-01-18 ENCOUNTER — Other Ambulatory Visit: Payer: Self-pay | Admitting: Internal Medicine

## 2021-01-18 DIAGNOSIS — Z1231 Encounter for screening mammogram for malignant neoplasm of breast: Secondary | ICD-10-CM

## 2021-04-24 ENCOUNTER — Ambulatory Visit
Admission: RE | Admit: 2021-04-24 | Discharge: 2021-04-24 | Disposition: A | Discharge status: Home / Self Care | Payer: Medicare PPO | Source: Ambulatory Visit | Attending: Internal Medicine | Admitting: Internal Medicine

## 2021-04-24 ENCOUNTER — Other Ambulatory Visit: Payer: Self-pay

## 2021-04-24 DIAGNOSIS — Z1231 Encounter for screening mammogram for malignant neoplasm of breast: Secondary | ICD-10-CM | POA: Diagnosis not present

## 2021-06-04 ENCOUNTER — Other Ambulatory Visit: Payer: Self-pay | Admitting: Otolaryngology

## 2021-06-04 DIAGNOSIS — R42 Dizziness and giddiness: Secondary | ICD-10-CM

## 2021-06-12 ENCOUNTER — Ambulatory Visit: Payer: Medicare PPO

## 2021-06-18 ENCOUNTER — Ambulatory Visit: Payer: Medicare PPO

## 2021-06-23 ENCOUNTER — Ambulatory Visit
Admission: RE | Admit: 2021-06-23 | Discharge: 2021-06-23 | Disposition: A | Discharge status: Home / Self Care | Payer: Medicare PPO | Source: Ambulatory Visit | Attending: Otolaryngology | Admitting: Otolaryngology

## 2021-06-23 DIAGNOSIS — R42 Dizziness and giddiness: Secondary | ICD-10-CM | POA: Diagnosis not present

## 2021-06-23 MED ORDER — GADOBUTROL 1 MMOL/ML IV SOLN
6.0000 mL | Freq: Once | INTRAVENOUS | Status: AC | PRN
  Administered 2021-06-23: 7.5 mL via INTRAVENOUS

## 2021-07-10 ENCOUNTER — Encounter: Payer: Self-pay | Admitting: Dermatology

## 2021-07-10 ENCOUNTER — Other Ambulatory Visit: Payer: Self-pay

## 2021-07-10 ENCOUNTER — Ambulatory Visit: Payer: Medicare PPO | Admitting: Dermatology

## 2021-07-10 DIAGNOSIS — L239 Allergic contact dermatitis, unspecified cause: Secondary | ICD-10-CM

## 2021-07-10 MED ORDER — METHYLPREDNISOLONE 4 MG PO TABS
4.0000 mg | ORAL_TABLET | Freq: Every day | ORAL | 0 refills | Status: DC
Start: 2021-07-10 — End: 2021-12-20

## 2021-07-10 NOTE — Patient Instructions (Addendum)
If you have any questions or concerns for your doctor, please call our main line at [phone] and press option 4 to reach your doctor's medical assistant. If no one answers, please leave a voicemail as directed and we will return your call as soon as possible. Messages left after 4 pm will be answered the following business day.   You may also send [place] a message via [place]. We typically respond to MyChart messages within 1-2 business days.  For prescription refills, please ask your pharmacy to contact our office. Our fax number is [phone].  If you have an urgent issue when the clinic is closed that cannot wait until the next business day, you can page your doctor at the number below.    Please note that while we do our best to be available for urgent issues outside of office hours, we are not available 24/7.   If you have an urgent issue and are unable to reach [place], you may choose to seek medical care at your doctor's office, retail clinic, urgent care center, or emergency room.  If you have a medical emergency, please immediately call 911 or go to the emergency department.  Pager Numbers  - Dr. Nehemiah Massed: [phone]  - Dr. Laurence Ferrari: [phone]  - Dr. Nicole Kindred: [phone]  In the event of inclement weather, please call our main line at [phone] for an update on the status of any delays or closures.  Dermatology Medication Tips: Please keep the boxes that topical medications come in in order to help keep track of the instructions about where and how to use these. Pharmacies typically print the medication instructions only on the boxes and not directly on the medication tubes.   If your medication is too expensive, please contact our office at [phone] option 4 or send [place] a message through [place].   We are unable to tell what your co-pay for medications will be in advance as this is different depending on your insurance coverage. However, we may be able to find a substitute  medication at lower cost or fill out paperwork to get insurance to cover a needed medication.   If a prior authorization is required to get your medication covered by your insurance company, please allow [place] 1-2 business days to complete this process.  Drug prices often vary depending on where the prescription is filled and some pharmacies may offer cheaper prices.  The website www.goodrx.com contains coupons for medications through different pharmacies. The prices here do not account for what the cost may be with help from insurance (it may be cheaper with your insurance), but the website can give you the price if you did not use any insurance.  - You can print the associated coupon and take it with your prescription to the pharmacy.  - You may also stop by our office during regular business hours and pick up a GoodRx coupon card.  - If you need your prescription sent electronically to a different pharmacy, notify our office through [facility] or by phone at [phone] option 4.    Start Sarna lotion for the itching

## 2021-07-10 NOTE — Progress Notes (Signed)
   New Patient Visit  Subjective  Cristina Duncan is a 65 y.o. female who presents for the following: Rash (Face, neck, R leg, 2-3 days started as a few bites then yesterday spreading, itchy, in April pt had poison ivy and had to take 2 rounds of methylPrednisolone 93m taper 6 tabs on day one, then decrease by one tablet every day until gone).  The following portions of the chart were reviewed this encounter and updated as appropriate:   Tobacco  Allergies  Meds  Problems  Med Hx  Surg Hx  Fam Hx     Review of Systems:  No other skin or systemic complaints except as noted in HPI or Assessment and Plan.  Objective  Well appearing patient in no apparent distress; mood and affect are within normal limits.  A focused examination was performed including face, chest, right leg. Relevant physical exam findings are noted in the Assessment and Plan.  face, neck, R leg Edema, erythema face, neck   Assessment & Plan  Allergic contact dermatitis, unspecified trigger -possibly airborne contact due to distribution of the face and neck She has severe involvement of the face and neck with edema and induration.  He has history of severe sensitivity to poison ivy with severe blistering reaction in the past.  We discussed possibilities of what this could be from but she stayed away from plants and yard work since she has had a severe reaction in the past. It could be fomite transfer of poison ivy from her dog or other sources face, neck, R leg Start methylprednisolone 466mpo qam as directed for 3 wk taper #150, 0rf  Pt given 3 week taper instructions Start Sarna lotion as needed for itching  Risks of prednisone taper discussed including mood irritability, insomnia, weight gain, stomach ulcers, increased risk of infection, increased blood sugar (diabetes), hypertension, osteoporosis with long-term or frequent use, and rare risk of avascular necrosis of the hip.    methylPREDNISolone (MEDROL) 4 MG  tablet - face, neck, R leg Take 1 tablet (4 mg total) by mouth daily. Take as directed for 3 week taper, patient given 3 week taper instructions  Return if symptoms worsen or fail to improve.  I, SoOthelia PullingRMA, am acting as scribe for DaSarina SerMD . Documentation: I have reviewed the above documentation for accuracy and completeness, and I agree with the above.  DaSarina SerMD

## 2021-07-16 ENCOUNTER — Telehealth: Payer: Self-pay

## 2021-07-16 NOTE — Telephone Encounter (Signed)
Pt called and stated that she is feeling absolutely miserable. She wants to know when she should expect to feel some relief with the methylprednisolone taper. Pt states that she has been using the refrigerated sarna lotion however that only gives her a very temporary relief. Pt states that she has been breaking out more since her visit on her inner thighs, waist, and arms.

## 2021-07-16 NOTE — Telephone Encounter (Signed)
Spoke with Cristina Duncan and advised her of Dr. Alveria Apley message. Scheduled Cristina Duncan for 07/18/21 to discuss steroid injection.

## 2021-07-18 ENCOUNTER — Ambulatory Visit: Payer: Medicare PPO | Admitting: Dermatology

## 2021-12-24 ENCOUNTER — Encounter
Admission: RE | Admit: 2021-12-24 | Discharge: 2021-12-24 | Disposition: A | Discharge status: Home / Self Care | Payer: Medicare PPO | Source: Ambulatory Visit | Attending: Obstetrics and Gynecology | Admitting: Obstetrics and Gynecology

## 2021-12-24 ENCOUNTER — Other Ambulatory Visit: Payer: Self-pay

## 2021-12-24 HISTORY — DX: Hypothyroidism, unspecified: E03.9

## 2021-12-24 NOTE — H&P (Signed)
Cristina Duncan is a 66 y.o. female who presents for [facility] hysteroscopy with removal of endometrial polyp, after endometrial biopsy procedure and saline sonogram today due to postmenopausal bleeding, she is on HRT: Estrace 2 mg and prometrium 200 mg    The patient underwent a preliminary transvaginal pelvic sonogram. - Uterus: normal 5.8 x 2.7 x 3.8 cm  - Ovaries: neither ovary seen, bilateral adnexa without masses - Endometrium: 3.78 mm  - Other: endometrial polyp 0.605 x 0.346 x 0.385 cm    EMBx 12/17/21:  FRAGMENTS WITH FEATURES  SUGGESTIVE OF AN ENDOMETRIAL POLYP, WITH GLANDULAR  CROWDING. NO ATYPIA SEEN. SEE COMMENT.  Vitals:   BP 120/78    Pulse 101    Ht 162.6 cm (5' 4" )    Wt 68.9 kg (152 lb)    BMI 26.09 kg/m     Exam:  Cardio: RRR Pulm: CTAB Abd: soft Pelvic; Deferred   Assessment and Plan:   Discussion of findings and decision making with patient prior to her leaving the office allowed [place] to make the plan for:  -D&C with hysteroscopy in the OR for removal of endometrial polyp   -Post-procedural instructions were reviewed and the patient expressed understanding. A written copy of these instructions was provided to the patient.    Post-menopausal bleeding -     Pathology Report - [facility]

## 2021-12-24 NOTE — Patient Instructions (Signed)
Your procedure is scheduled on: 12/27/21 Report to [place]. To find out your arrival time please call [phone] between 1PM - 3PM on 12/26/21.  Remember: Instructions that are not followed completely may result in serious medical risk, up to and including death, or upon the discretion of your surgeon and anesthesiologist your surgery may need to be rescheduled.     _X__ 1. Do not eat food after midnight the night before your procedure.                 No gum chewing or hard candies. You may drink clear liquids up to 2 hours                 before you are scheduled to arrive for your surgery- DO not drink clear                 liquids within 2 hours of the start of your surgery.                 Clear Liquids include:  water, apple juice without pulp, clear carbohydrate                 drink such as Clearfast or Gatorade, Black Coffee or Tea (Do not add                 anything to coffee or tea). Diabetics water only  __X__2.  On the morning of surgery brush your teeth with toothpaste and water, you                 may rinse your mouth with mouthwash if you wish.  Do not swallow any              toothpaste of mouthwash.     _X__ 3.  No Alcohol for 24 hours before or after surgery.   _X__ 4.  Do Not Smoke or use e-cigarettes For 24 Hours Prior to Your Surgery.                 Do not use any chewable tobacco products for at least 6 hours prior to                 surgery.  ____  5.  Bring all medications with you on the day of surgery if instructed.   __X__  6.  Notify your doctor if there is any change in your medical condition      (cold, fever, infections).     Do not wear jewelry, make-up, hairpins, clips or nail polish. Do not wear lotions, powders, or perfumes.  Do not shave body hair 48 hours prior to surgery. Men may shave face and neck. Do not bring valuables to the hospital.    [facility] is not responsible for any  belongings or valuables.  Contacts, dentures/partials or body piercings may not be worn into surgery. Bring a case for your contacts, glasses or hearing aids, a denture cup will be supplied. Leave your suitcase in the car. After surgery it may be brought to your room. For patients admitted to the hospital, discharge time is determined by your treatment team.   Patients discharged the day of surgery will not be allowed to drive home.   Please read over the following fact sheets that you were given:     __X__ Take these medicines the morning of surgery with A SIP OF WATER:  1. DULoxetine (CYMBALTA) 90 MG capsule  2. levothyroxine (SYNTHROID, LEVOTHROID) 75 MCG tablet  3. May use eye drops and nasal spray  4.  5.  6.  ____ Fleet Enema (as directed)   ____ Use CHG Soap/SAGE wipes as directed  ____ Use inhalers on the day of surgery  ____ Stop metformin/Janumet/Farxiga 2 days prior to surgery    ____ Take 1/2 of usual insulin dose the night before surgery. No insulin the morning          of surgery.   ____ Stop Blood Thinners Coumadin/Plavix/Xarelto/Pleta/Pradaxa/Eliquis/Effient/Aspirin  on   Or contact your Surgeon, Cardiologist or Medical Doctor regarding  ability to stop your blood thinners  __X__ Stop Anti-inflammatories 7 days before surgery such as Advil, Ibuprofen, Motrin,  BC or Goodies Powder, Naprosyn, Naproxen, Aleve, Aspirin    __X__ Stop all herbals and supplements, fish oil or vitamins  until after surgery.    ____ Bring C-Pap to the hospital.    How to Use an Incentive Spirometer An incentive spirometer is a tool that measures how well you are filling your lungs with each breath. Learning to take long, deep breaths using this tool can help you keep your lungs clear and active. This may help to reverse or lessen your chance of developing breathing (pulmonary) problems, especially infection. You may be asked to use a spirometer: After a surgery. If you have a  lung problem or a history of smoking. After a long period of time when you have been unable to move or be active. If the spirometer includes an indicator to show the highest number that you have reached, your health care provider or respiratory therapist will help you set a goal. Keep a log of your progress as told by your health care provider. What are the risks? Breathing too quickly may cause dizziness or cause you to pass out. Take your time so you do not get dizzy or light-headed. If you are in pain, you may need to take pain medicine before doing incentive spirometry. It is harder to take a deep breath if you are having pain. How to use your incentive spirometer  Sit up on the edge of your bed or on a chair. Hold the incentive spirometer so that it is in an upright position. Before you use the spirometer, breathe out normally. Place the mouthpiece in your mouth. Make sure your lips are closed tightly around it. Breathe in slowly and as deeply as you can through your mouth, causing the piston or the ball to rise toward the top of the chamber. Hold your breath for 3-5 seconds, or for as long as possible. If the spirometer includes a coach indicator, use this to guide you in breathing. Slow down your breathing if the indicator goes above the marked areas. Remove the mouthpiece from your mouth and breathe out normally. The piston or ball will return to the bottom of the chamber. Rest for a few seconds, then repeat the steps 10 or more times. Take your time and take a few normal breaths between deep breaths so that you do not get dizzy or light-headed. Do this every 1-2 hours when you are awake. If the spirometer includes a goal marker to show the highest number you have reached (best effort), use this as a goal to work toward during each repetition. After each set of 10 deep breaths, cough a few times. This will help to make sure that your lungs are clear. If you have an  incision on your chest  or abdomen from surgery, place a pillow or a rolled-up towel firmly against the incision when you cough. This can help to reduce pain while taking deep breaths and coughing. General tips When you are able to get out of bed: Walk around often. Continue to take deep breaths and cough in order to clear your lungs. Keep using the incentive spirometer until your health care provider says it is okay to stop using it. If you have been in the hospital, you may be told to keep using the spirometer at home. Contact a health care provider if: You are having difficulty using the spirometer. You have trouble using the spirometer as often as instructed. Your pain medicine is not giving enough relief for you to use the spirometer as told. You have a fever. Get help right away if: You develop shortness of breath. You develop a cough with bloody mucus from the lungs. You have fluid or blood coming from an incision site after you cough. Summary An incentive spirometer is a tool that can help you learn to take long, deep breaths to keep your lungs clear and active. You may be asked to use a spirometer after a surgery, if you have a lung problem or a history of smoking, or if you have been inactive for a long period of time. Use your incentive spirometer as instructed every 1-2 hours while you are awake. If you have an incision on your chest or abdomen, place a pillow or a rolled-up towel firmly against your incision when you cough. This will help to reduce pain. Get help right away if you have shortness of breath, you cough up bloody mucus, or blood comes from your incision when you cough. This information is not intended to replace advice given to you by your health care provider. Make sure you discuss any questions you have with your health care provider. Document Revised: 01/23/2020 Document Reviewed: 01/23/2020 Elsevier Patient Education  [place].

## 2021-12-25 ENCOUNTER — Other Ambulatory Visit
Admission: RE | Admit: 2021-12-25 | Discharge: 2021-12-25 | Disposition: A | Discharge status: Home / Self Care | Payer: Medicare PPO | Source: Ambulatory Visit | Attending: Obstetrics and Gynecology | Admitting: Obstetrics and Gynecology

## 2021-12-25 DIAGNOSIS — N950 Postmenopausal bleeding: Secondary | ICD-10-CM | POA: Insufficient documentation

## 2021-12-25 DIAGNOSIS — Z01810 Encounter for preprocedural cardiovascular examination: Secondary | ICD-10-CM | POA: Insufficient documentation

## 2021-12-25 LAB — BASIC METABOLIC PANEL
Anion gap: 4 — ABNORMAL LOW (ref 5–15)
BUN: 13 mg/dL (ref 8–23)
CO2: 26 mmol/L (ref 22–32)
Calcium: 9.1 mg/dL (ref 8.9–10.3)
Chloride: 108 mmol/L (ref 98–111)
Creatinine, Ser: 0.9 mg/dL (ref 0.44–1.00)
GFR, Estimated: >60 mL/min (ref >60)
Glucose, Bld: 105 mg/dL — ABNORMAL HIGH (ref 70–99)
Potassium: 3.3 mmol/L — ABNORMAL LOW (ref 3.5–5.1)
Sodium: 138 mmol/L (ref 135–145)

## 2021-12-25 LAB — TYPE AND SCREEN
ABO/RH(D): O POS
Antibody Screen: NEGATIVE

## 2021-12-25 LAB — CBC
HCT: 36.2 % (ref 36.0–46.0)
Hemoglobin: 11.4 g/dL — ABNORMAL LOW (ref 12.0–15.0)
MCH: 28 pg (ref 26.0–34.0)
MCHC: 31.5 g/dL (ref 30.0–36.0)
MCV: 88.9 fL (ref 80.0–100.0)
Platelets: 285 10*3/uL (ref 150–400)
RBC: 4.07 MIL/uL (ref 3.87–5.11)
RDW: 13.2 % (ref 11.5–15.5)
WBC: 5.8 10*3/uL (ref 4.0–10.5)
nRBC: 0 % (ref 0.0–0.2)

## 2021-12-27 ENCOUNTER — Other Ambulatory Visit: Payer: Self-pay

## 2021-12-27 ENCOUNTER — Encounter: Payer: Self-pay | Admitting: Obstetrics and Gynecology

## 2021-12-27 ENCOUNTER — Encounter
Admission: RE | Disposition: A | Discharge status: Home / Self Care | Payer: Self-pay | Source: Home / Self Care | Attending: Obstetrics and Gynecology

## 2021-12-27 ENCOUNTER — Ambulatory Visit: Payer: Medicare PPO | Admitting: Urgent Care

## 2021-12-27 ENCOUNTER — Ambulatory Visit
Admission: RE | Admit: 2021-12-27 | Discharge: 2021-12-27 | Disposition: A | Discharge status: Home / Self Care | Payer: Medicare PPO | Attending: Obstetrics and Gynecology | Admitting: Obstetrics and Gynecology

## 2021-12-27 ENCOUNTER — Ambulatory Visit: Payer: Medicare PPO | Admitting: Certified Registered"

## 2021-12-27 DIAGNOSIS — N840 Polyp of corpus uteri: Secondary | ICD-10-CM | POA: Diagnosis not present

## 2021-12-27 DIAGNOSIS — Z79890 Hormone replacement therapy (postmenopausal): Secondary | ICD-10-CM | POA: Diagnosis not present

## 2021-12-27 DIAGNOSIS — N950 Postmenopausal bleeding: Secondary | ICD-10-CM | POA: Insufficient documentation

## 2021-12-27 HISTORY — PX: HYSTEROSCOPY WITH D & C: SHX1775

## 2021-12-27 LAB — ABO/RH: ABO/RH(D): O POS

## 2021-12-27 SURGERY — DILATATION AND CURETTAGE /HYSTEROSCOPY
Anesthesia: General

## 2021-12-27 MED ORDER — SILVER NITRATE-POT NITRATE 75-25 % EX MISC
CUTANEOUS | Status: DC | PRN
  Administered 2021-12-27: 2

## 2021-12-27 MED ORDER — FENTANYL CITRATE (PF) 100 MCG/2ML IJ SOLN
INTRAMUSCULAR | Status: AC
  Administered 2021-12-27: 25 ug via INTRAVENOUS
  Filled 2021-12-27: qty 2

## 2021-12-27 MED ORDER — PROPOFOL 500 MG/50ML IV EMUL
INTRAVENOUS | Status: AC
  Filled 2021-12-27: qty 50

## 2021-12-27 MED ORDER — KETOROLAC TROMETHAMINE 30 MG/ML IJ SOLN
INTRAMUSCULAR | Status: AC
  Filled 2021-12-27: qty 1

## 2021-12-27 MED ORDER — MIDAZOLAM HCL 2 MG/2ML IJ SOLN
INTRAMUSCULAR | Status: AC
  Filled 2021-12-27: qty 2

## 2021-12-27 MED ORDER — DEXAMETHASONE SODIUM PHOSPHATE 10 MG/ML IJ SOLN
INTRAMUSCULAR | Status: DC | PRN
  Administered 2021-12-27: 5 mg via INTRAVENOUS

## 2021-12-27 MED ORDER — MIDAZOLAM HCL 2 MG/2ML IJ SOLN
INTRAMUSCULAR | Status: DC | PRN
  Administered 2021-12-27: 2 mg via INTRAVENOUS

## 2021-12-27 MED ORDER — ACETAMINOPHEN 10 MG/ML IV SOLN
INTRAVENOUS | Status: AC
  Administered 2021-12-27: 1000 mg via INTRAVENOUS
  Filled 2021-12-27: qty 100

## 2021-12-27 MED ORDER — LIDOCAINE HCL (CARDIAC) PF 100 MG/5ML IV SOSY
PREFILLED_SYRINGE | INTRAVENOUS | Status: DC | PRN
  Administered 2021-12-27: 50 mg via INTRAVENOUS

## 2021-12-27 MED ORDER — FAMOTIDINE 20 MG PO TABS
ORAL_TABLET | ORAL | Status: AC
  Administered 2021-12-27: 20 mg
  Filled 2021-12-27: qty 1

## 2021-12-27 MED ORDER — LIDOCAINE HCL (PF) 2 % IJ SOLN
INTRAMUSCULAR | Status: AC
  Filled 2021-12-27: qty 5

## 2021-12-27 MED ORDER — PROPOFOL 10 MG/ML IV BOLUS
INTRAVENOUS | Status: AC
  Filled 2021-12-27: qty 20

## 2021-12-27 MED ORDER — FAMOTIDINE 20 MG PO TABS: 20.0000 mg | ORAL_TABLET | Freq: Once | ORAL | Status: AC

## 2021-12-27 MED ORDER — POVIDONE-IODINE 10 % EX SWAB
2.0000 "application " | Freq: Once | CUTANEOUS | Status: AC
  Administered 2021-12-27: 2 via TOPICAL

## 2021-12-27 MED ORDER — CHLORHEXIDINE GLUCONATE 0.12 % MT SOLN
OROMUCOSAL | Status: AC
  Administered 2021-12-27: 15 mL via OROMUCOSAL
  Filled 2021-12-27: qty 15

## 2021-12-27 MED ORDER — ACETAMINOPHEN 10 MG/ML IV SOLN: 1000.0000 mg | Freq: Once | INTRAVENOUS | Status: AC

## 2021-12-27 MED ORDER — ONDANSETRON HCL 4 MG/2ML IJ SOLN
INTRAMUSCULAR | Status: DC | PRN
  Administered 2021-12-27: 4 mg via INTRAVENOUS

## 2021-12-27 MED ORDER — ORAL CARE MOUTH RINSE: 15.0000 mL | Freq: Once | OROMUCOSAL | Status: AC

## 2021-12-27 MED ORDER — DEXAMETHASONE SODIUM PHOSPHATE 10 MG/ML IJ SOLN
INTRAMUSCULAR | Status: AC
  Filled 2021-12-27: qty 1

## 2021-12-27 MED ORDER — ONDANSETRON HCL 4 MG/2ML IJ SOLN: 4.0000 mg | Freq: Once | INTRAMUSCULAR | Status: DC | PRN

## 2021-12-27 MED ORDER — KETOROLAC TROMETHAMINE 30 MG/ML IJ SOLN
INTRAMUSCULAR | Status: DC | PRN
  Administered 2021-12-27: 30 mg via INTRAVENOUS

## 2021-12-27 MED ORDER — ONDANSETRON HCL 4 MG/2ML IJ SOLN
INTRAMUSCULAR | Status: AC
  Filled 2021-12-27: qty 2

## 2021-12-27 MED ORDER — SILVER NITRATE-POT NITRATE 75-25 % EX MISC
CUTANEOUS | Status: AC
  Filled 2021-12-27: qty 10

## 2021-12-27 MED ORDER — CHLORHEXIDINE GLUCONATE 0.12 % MT SOLN: 15.0000 mL | Freq: Once | OROMUCOSAL | Status: AC

## 2021-12-27 MED ORDER — LACTATED RINGERS IV SOLN: INTRAVENOUS | Status: DC

## 2021-12-27 MED ORDER — 0.9 % SODIUM CHLORIDE (POUR BTL) OPTIME
TOPICAL | Status: DC | PRN
  Administered 2021-12-27: 500 mL

## 2021-12-27 MED ORDER — FENTANYL CITRATE (PF) 100 MCG/2ML IJ SOLN
25.0000 ug | INTRAMUSCULAR | Status: DC | PRN
  Administered 2021-12-27 (×3): 25 ug via INTRAVENOUS

## 2021-12-27 MED ORDER — PROPOFOL 10 MG/ML IV BOLUS
INTRAVENOUS | Status: DC | PRN
  Administered 2021-12-27: 125 ug/kg/min via INTRAVENOUS
  Administered 2021-12-27: 90 mg via INTRAVENOUS

## 2021-12-27 SURGICAL SUPPLY — 23 items
BACTOSHIELD CHG 4% 4OZ (MISCELLANEOUS) ×1
BAG INFUSER PRESSURE 100CC (MISCELLANEOUS) ×1 IMPLANT
DRSG TELFA 3X8 NADH (GAUZE/BANDAGES/DRESSINGS) IMPLANT
ELECT REM PT RETURN 9FT ADLT (ELECTROSURGICAL) ×2
ELECTRODE REM PT RTRN 9FT ADLT (ELECTROSURGICAL) ×1 IMPLANT
GAUZE 4X4 16PLY ~~LOC~~+RFID DBL (SPONGE) ×4 IMPLANT
GLOVE SURG ENC MOIS LTX SZ7 (GLOVE) ×2 IMPLANT
GLOVE SURG UNDER LTX SZ7.5 (GLOVE) ×2 IMPLANT
GOWN STRL REUS W/ TWL LRG LVL3 (GOWN DISPOSABLE) ×2 IMPLANT
GOWN STRL REUS W/TWL LRG LVL3 (GOWN DISPOSABLE) ×2
IV NS IRRIG 3000ML ARTHROMATIC (IV SOLUTION) ×2 IMPLANT
KIT PROCEDURE FLUENT (KITS) ×2 IMPLANT
KIT TURNOVER CYSTO (KITS) ×2 IMPLANT
MANIFOLD NEPTUNE II (INSTRUMENTS) ×2 IMPLANT
PACK DNC HYST (MISCELLANEOUS) ×2 IMPLANT
PAD DRESSING TELFA 3X8 NADH (GAUZE/BANDAGES/DRESSINGS) IMPLANT
PAD OB MATERNITY 4.3X12.25 (PERSONAL CARE ITEMS) ×2 IMPLANT
PAD PREP 24X41 OB/GYN DISP (PERSONAL CARE ITEMS) ×2 IMPLANT
SCRUB CHG 4% DYNA-HEX 4OZ (MISCELLANEOUS) ×1 IMPLANT
SEAL ROD LENS SCOPE MYOSURE (ABLATOR) ×2 IMPLANT
SET CYSTO W/LG BORE CLAMP LF (SET/KITS/TRAYS/PACK) IMPLANT
TUBING CONNECTING 10 (TUBING) ×2 IMPLANT
WATER STERILE IRR 500ML POUR (IV SOLUTION) ×2 IMPLANT

## 2021-12-27 NOTE — Transfer of Care (Signed)
Immediate Anesthesia Transfer of Care Note  Patient: Cristina Duncan  Procedure(s) Performed: DILATATION AND CURETTAGE /HYSTEROSCOPY  Patient Location: PACU  Anesthesia Type:General  Level of Consciousness: awake  Airway & Oxygen Therapy: Patient Spontanous Breathing  Post-op Assessment: Report given to RN and Post -op Vital signs reviewed and stable  Post vital signs: Reviewed and stable  Last Vitals:  Vitals Value Taken Time  BP 148/83 12/27/21 1630  Temp 36.2 C 12/27/21 1630  Pulse 84 12/27/21 1635  Resp 21 12/27/21 1635  SpO2 89 % 12/27/21 1635  Vitals shown include unvalidated device data.  Last Pain:  Vitals:   12/27/21 1212  TempSrc: Temporal  PainSc: 0-No pain      Patients Stated Pain Goal: 0 (71/82/09 9068)  Complications: No notable events documented.

## 2021-12-27 NOTE — Anesthesia Preprocedure Evaluation (Addendum)
Anesthesia Evaluation  Patient identified by MRN, date of birth, ID band Patient awake    Reviewed: Allergy & Precautions, H&P , NPO status , Patient's Chart, lab work & pertinent test results, reviewed documented beta blocker date and time   History of Anesthesia Complications (+) PONV and history of anesthetic complications  Airway Mallampati: II  TM Distance: >3 FB Neck ROM: full    Dental  (+) Teeth Intact   Pulmonary neg pulmonary ROS, asthma ,    Pulmonary exam normal        Cardiovascular Exercise Tolerance: Good METS: negative cardio ROS Normal cardiovascular exam Rate:Normal     Neuro/Psych  Headaches, PSYCHIATRIC DISORDERS Anxiety Depression  Neuromuscular disease    GI/Hepatic Neg liver ROS, PUD,   Endo/Other  Hypothyroidism   Renal/GU negative Renal ROS  negative genitourinary   Musculoskeletal   Abdominal   Peds  Hematology  (+) Blood dyscrasia, anemia ,   Anesthesia Other Findings   Reproductive/Obstetrics negative OB ROS                            Anesthesia Physical Anesthesia Plan  ASA: 3  Anesthesia Plan: General LMA   Post-op Pain Management:    Induction:   PONV Risk Score and Plan: 4 or greater  Airway Management Planned:   Additional Equipment:   Intra-op Plan:   Post-operative Plan:   Informed Consent: I have reviewed the patients History and Physical, chart, labs and discussed the procedure including the risks, benefits and alternatives for the proposed anesthesia with the patient or authorized representative who has indicated his/her understanding and acceptance.       Plan Discussed with: CRNA  Anesthesia Plan Comments:        Anesthesia Quick Evaluation

## 2021-12-27 NOTE — Interval H&P Note (Signed)
History and Physical Interval Note:  12/27/2021 12:38 PM  Cristina Duncan  has presented today for surgery, with the diagnosis of post menopausal bleeding, polyp.  The various methods of treatment have been discussed with the patient and family. After consideration of risks, benefits and other options for treatment, the patient has consented to  Procedure(s): [place] /HYSTEROSCOPY, POSSIBLE POLYPECTOMY (N/A) as a surgical intervention.  The patient's history has been reviewed, patient examined, no change in status, stable for surgery.  I have reviewed the patient's chart and labs.  Questions were answered to the patient's satisfaction.     [name]

## 2021-12-27 NOTE — Anesthesia Procedure Notes (Signed)
Procedure Name: LMA Insertion Date/Time: 12/27/2021 3:17 PM Performed by: Cammie Sickle, CRNA Pre-anesthesia Checklist: Patient identified, Patient being monitored, Timeout performed, Emergency Drugs available and Suction available Patient Re-evaluated:Patient Re-evaluated prior to induction Oxygen Delivery Method: Circle system utilized Preoxygenation: Pre-oxygenation with 100% oxygen Induction Type: IV induction Ventilation: Mask ventilation without difficulty LMA: LMA inserted LMA Size: 4.0 Tube type: Oral Number of attempts: 1 Placement Confirmation: positive ETCO2 and breath sounds checked- equal and bilateral Tube secured with: Tape Dental Injury: Teeth and Oropharynx as per pre-operative assessment

## 2021-12-27 NOTE — Anesthesia Postprocedure Evaluation (Signed)
Anesthesia Post Note  Patient: Cristina Duncan  Procedure(s) Performed: DILATATION AND CURETTAGE /HYSTEROSCOPY  Patient location during evaluation: PACU Anesthesia Type: General Level of consciousness: awake and alert Pain management: pain level controlled Vital Signs Assessment: post-procedure vital signs reviewed and stable Respiratory status: spontaneous breathing, nonlabored ventilation, respiratory function stable and patient connected to nasal cannula oxygen Cardiovascular status: blood pressure returned to baseline and stable Postop Assessment: no apparent nausea or vomiting Anesthetic complications: no   No notable events documented.   Last Vitals:  Vitals:   12/27/21 1715 12/27/21 1736  BP: (!) 138/94 112/78  Pulse: 99 74  Resp: 18 16  Temp: (!) 36.2 C (!) 36.4 C  SpO2: 97% 95%    Last Pain:  Vitals:   12/27/21 1736  TempSrc: Temporal  PainSc: 4                  Arita Miss

## 2021-12-27 NOTE — Discharge Instructions (Addendum)
Discharge instructions after a hysteroscopy with dilation and curettage  Signs and Symptoms to Report  Call our office at [phone] if you have any of the following:    Fever over 100.4 degrees or higher  Severe stomach pain not relieved with pain medications  Bright red bleeding thats heavier than a period that does not slow with rest after the first 24 hours  To go the bathroom a lot (frequency), you cant hold your urine (urgency), or it hurts when you empty your bladder (urinate)  Chest pain  Shortness of breath  Pain in the calves of your legs  Severe nausea and vomiting not relieved with anti-nausea medications  Any concerns  What You Can Expect after Surgery  You may see some pink tinged, bloody fluid. This is normal. You may also have cramping for several days.   Activities after Your Discharge Follow these guidelines to help speed your recovery at home:  Dont drive if you are in pain or taking narcotic pain medicine. You may drive when you can safely slam on the brakes, turn the wheel forcefully, and rotate your torso comfortably. This is typically 4-7 days. Practice in a parking lot or side street prior to attempting to drive regularly.   Ask others to help with household chores for 4 weeks.  Dont do strenuous activities, exercises, or sports like vacuuming, tennis, squash, etc. until your doctor says it is safe to do so.  Walk as you feel able. Rest often since it may take a week or two for your energy level to return to normal.   You may climb stairs  Avoid constipation:   -Eat fruits, vegetables, and whole grains. Eat small meals as your appetite will take time to return to normal.   -Drink 6 to 8 glasses of water each day unless your doctor has told you to limit your fluids.   -Use a laxative or stool softener as needed if constipation becomes a problem. You may take Miralax, metamucil, Citrucil, Colace, Senekot, FiberCon, etc. If this does not relieve the  constipation, try two tablespoons of Milk Of Magnesia every 8 hours until your bowels move.   You may shower.   Do not get in a hot tub, swimming pool, etc. until your doctor agrees.  Do not douche, use tampons, or have sex until your doctor says it is okay, usually about 2 weeks.  Take your pain medicine when you need it. The medicine may not work as well if the pain is bad.  Take the medicines you were taking before surgery. Other medications you might need are pain medications (ibuprofen), medications for constipation (Colace) and nausea medications (Zofran).    AMBULATORY SURGERY  DISCHARGE INSTRUCTIONS   The drugs that you were given will stay in your system until tomorrow so for the next 24 hours you should not:  Drive an automobile Make any legal decisions Drink any alcoholic beverage   You may resume regular meals tomorrow.  Today it is better to start with liquids and gradually work up to solid foods.  You may eat anything you prefer, but it is better to start with liquids, then soup and crackers, and gradually work up to solid foods.   Please notify your doctor immediately if you have any unusual bleeding, trouble breathing, redness and pain at the surgery site, drainage, fever, or pain not relieved by medication.    Your post-operative visit with Dr.  is: Date:                        Time:    Please call to schedule your post-operative visit.  Additional Instructions:

## 2021-12-27 NOTE — Op Note (Signed)
Operative Report Hysteroscopy with Dilation and Curettage   Indications: Postmenopausal bleeding   Pre-operative Diagnosis: Endometrial polyp   Post-operative Diagnosis: same.  Procedure: 1. Exam under anesthesia 2. Fractional D&C 3. Hysteroscopy 4. Polypectomy  Surgeon: [name], MD  Assistant(s):  None  Anesthesia: General LMA anesthesia  Anesthesiologist: Arita Miss, MD Anesthesiologist: Arita Miss, MD CRNA: Cammie Sickle, CRNA  Estimated Blood Loss:   20         Intraoperative medications:  Toradol         Total IV Fluids: 1281m  Urine Output: 950m Total Fluid Deficit:  95 mL          Specimens: Endocervical curettings, endometrial curettings         Complications:  None; patient tolerated the procedure well.         Disposition: PACU - hemodynamically stable.         Condition: stable  Findings: Uterus measuring 8 cm by sound; normal cervix, vagina, perineum. Atrophic tissue with right sided small polyp  Indication for procedure/Consents: [age].o. F here for scheduled surgery for the aforementioned diagnoses.    Risks of surgery were discussed with the patient including but not limited to: bleeding which may require transfusion; infection which may require antibiotics; injury to uterus or surrounding organs; intrauterine scarring which may impair future fertility; need for additional procedures including laparotomy or laparoscopy; and other postoperative/anesthesia complications. Written informed consent was obtained.    Procedure Details:  Fractional D&C only  The patient was taken to the operating room where anesthesia was administered and was found to be adequate.  After a formal and adequate timeout was performed, she was placed in the dorsal lithotomy position and examined with the above findings. She was then prepped and draped in the sterile manner.   Her bladder was catheterized for an estimated amount of clear, yellow urine. A weighed  speculum was then placed in the patient's vagina and a single tooth tenaculum was applied to the anterior and posterior lips of the cervix.  Her cervix was serially dilated to 15 FrPakistansing Hanks dilators.  An ECC was performed. The hysteroscope was introduced to reveal the above findings. A sharp curettage was then performed until there was a gritty texture in all four quadrants.  The tenaculum was removed from the anterior lip of the cervix and the vaginal speculum was removed after applying silver nitrate for good hemostasis.   The patient tolerated the procedure well and was taken to the recovery area awake and in stable condition. She received iv acetaminophen and Toradol prior to leaving the OR.  The patient will be discharged to home as per PACU criteria. Routine postoperative instructions given.  She was prescribed Ibuprofen and Colace.  She will follow up in the clinic in two weeks for postoperative evaluation.

## 2021-12-28 ENCOUNTER — Encounter: Payer: Self-pay | Admitting: Obstetrics and Gynecology

## 2021-12-31 LAB — SURGICAL PATHOLOGY

## 2022-01-08 IMAGING — MG MM DIGITAL SCREENING BILAT W/ TOMO AND CAD
6 of 10 series · 6 of 30 positions shown · non-contrast
Comparison: Previous exam(s).

CLINICAL DATA: Screening.

EXAM:
DIGITAL SCREENING BILATERAL MAMMOGRAM WITH TOMOSYNTHESIS AND CAD
TECHNIQUE: Bilateral screening digital craniocaudal and mediolateral oblique
mammograms were obtained. Bilateral screening digital breast
tomosynthesis was performed. The images were evaluated with
computer-aided detection.

[L CC synth-2D (1 of 2)]
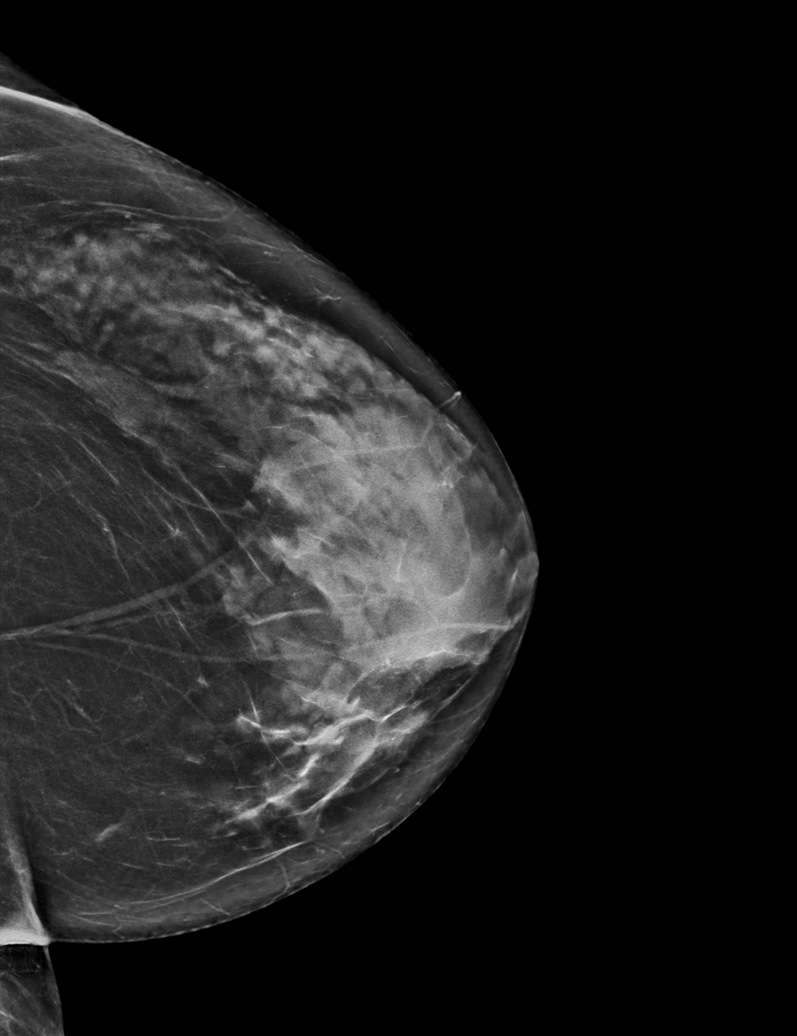

[R CC synth-2D]
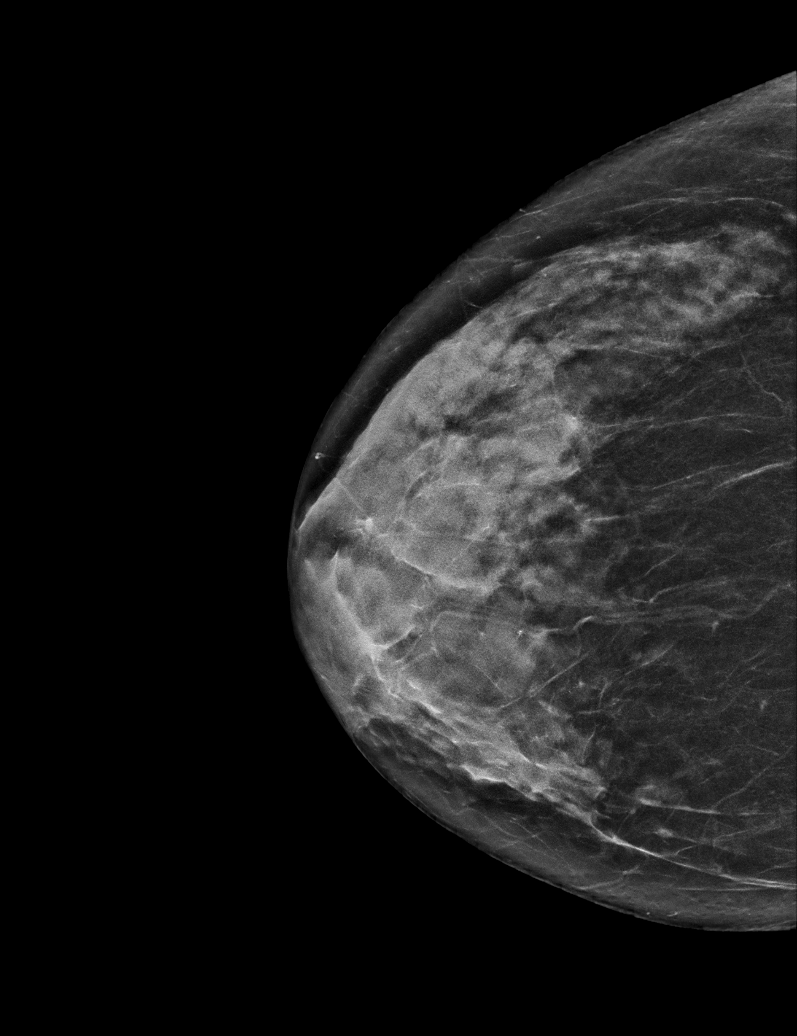

[R MLO synth-2D]
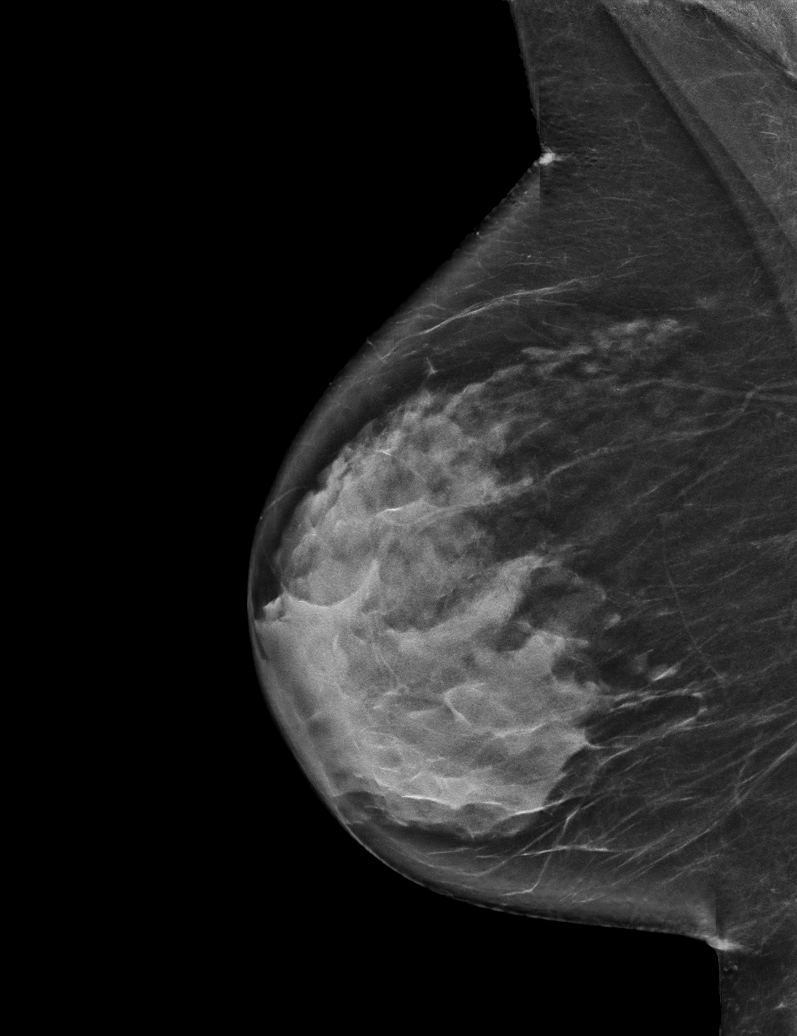

[L CC synth-2D (2 of 2)]
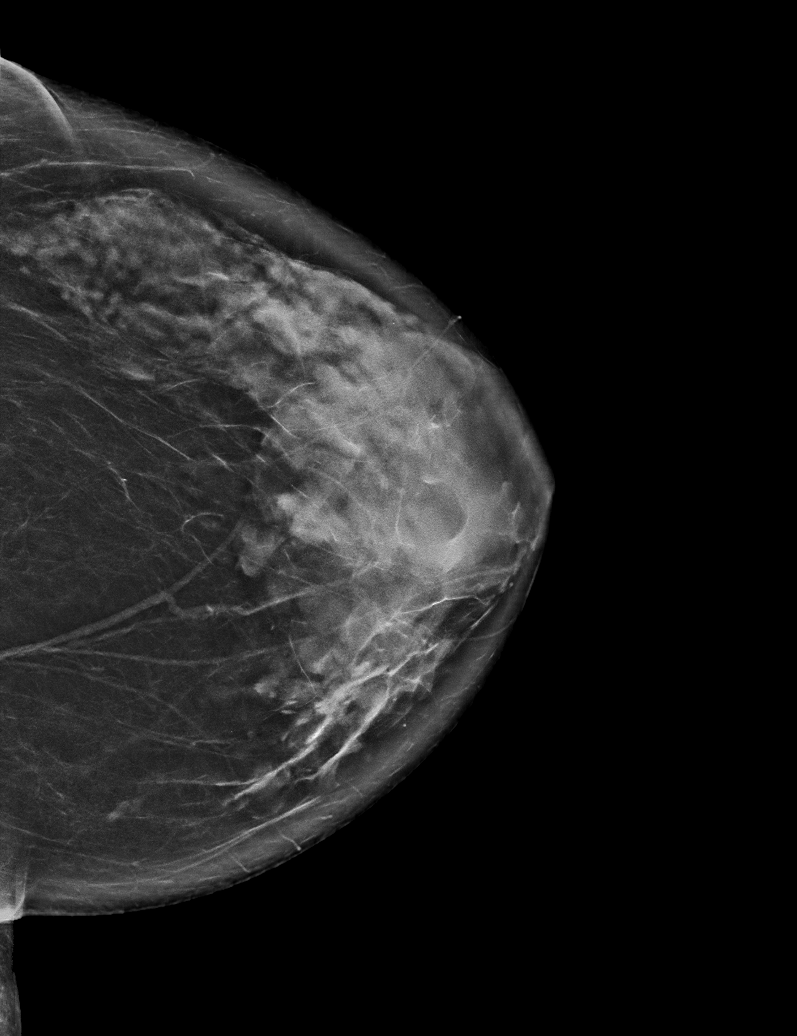

[L MLO synth-2D]
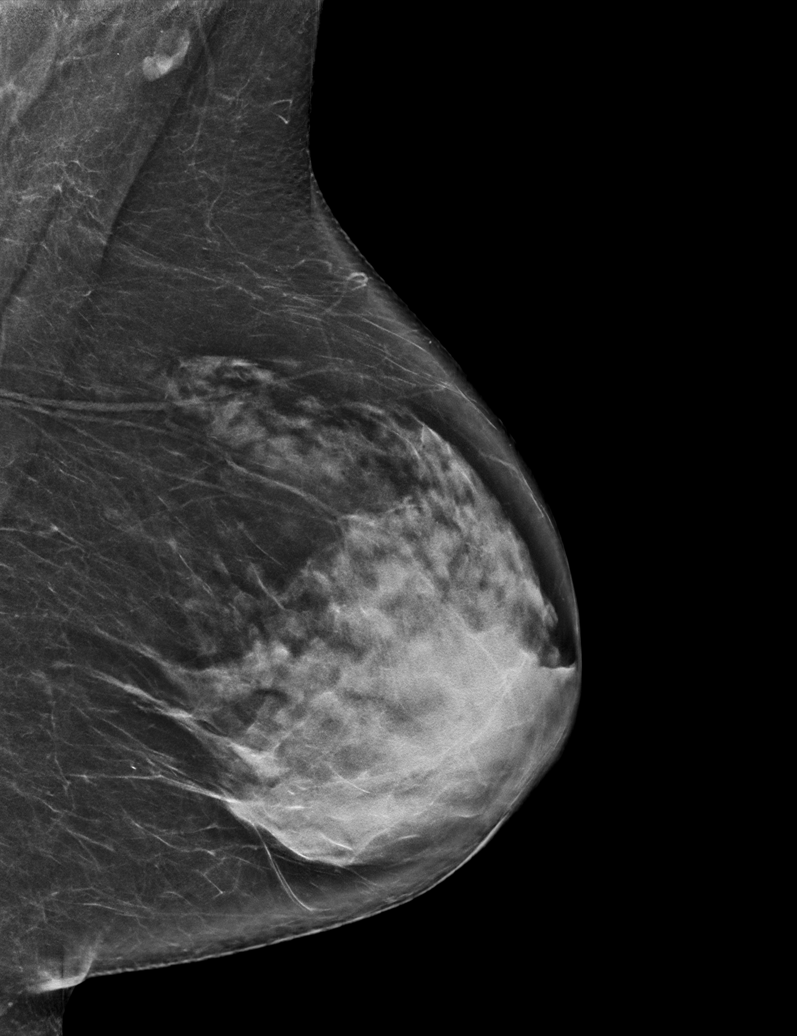

[L MLO tomo · tomo slice 35/70.0]
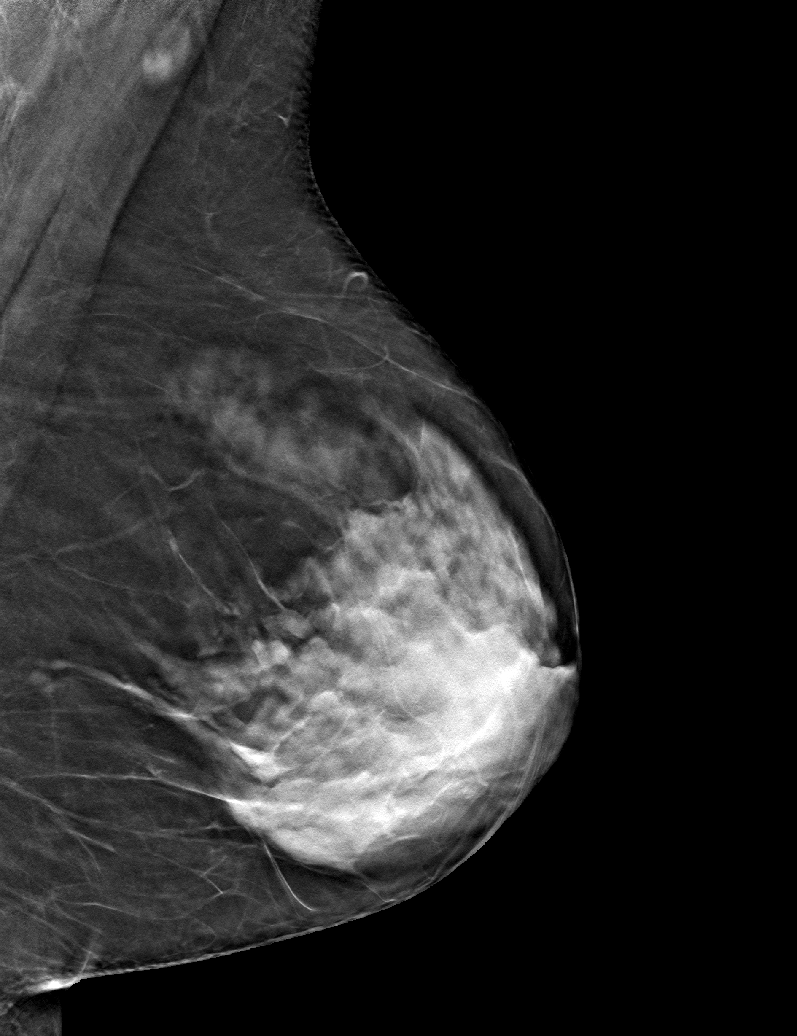

[6 of 30 positions shown; findings below may reference images not displayed]

ACR Breast Density Category d: The breast tissue is extremely dense,
which lowers the sensitivity of mammography
FINDINGS: There are no findings suspicious for malignancy. The images were
evaluated with computer-aided detection.
IMPRESSION: No mammographic evidence of malignancy. A result letter of this
screening mammogram will be mailed directly to the patient.

RECOMMENDATION:
Screening mammogram in one year. (Code:95-0-E9V)

BI-RADS CATEGORY  1: Negative.

## 2022-01-30 ENCOUNTER — Telehealth: Payer: Self-pay

## 2022-01-30 NOTE — Telephone Encounter (Signed)
CALLED PATIENT NO ANSWER LEFT VOICEMAIL FOR A CALL BACK ? ?

## 2022-02-23 NOTE — Discharge Instructions (Signed)
?  Instructions after Hand / Wrist Surgery ? ? Deshawnda Acrey P. Holley Bouche., M.D. ? Dept. of Home ? [facility] ? [place] ? [place], [place]  [address] ? ? Phone: [phone]   Fax: [phone] ? ? ?DIET: ?Drink plenty of non-alcoholic fluids & begin a light diet. ?Resume your normal diet the day after surgery. ? ?ACTIVITY:  ?Keep the hand elevated above the level of the elbow. ?Begin gently moving the fingers on a regular basis to avoid stiffness. ?Avoid any heavy lifting, pushing, or pulling with the operative hand. ?Do not drive or operate any equipment until instructed. ? ?WOUND CARE:  ?Keep the splint/bandage clean and dry.  ?The splint and stitches will be removed in the office. ?Continue to use the ice packs periodically to reduce pain and swelling. ?You may bathe or shower after the stitches are removed at the first office visit following surgery. ? ?MEDICATIONS: ?You may resume your regular medications. ?Please take the pain medication as prescribed. ?Do not take pain medication on an empty stomach. ?Do not drive or drink alcoholic beverages when taking pain medications. ? ?CALL THE OFFICE FOR: ?Temperature above 101 degrees ?Excessive bleeding or drainage on the dressing. ?Excessive swelling, coldness, or paleness of the fingers. ?Persistent nausea and vomiting. ? ?FOLLOW-UP:  ?You should have an appointment to return to the office in 7-10 days after surgery.  ? ?REMEMBER: R.I.C.E. = Rest, Ice, Compression, Elevation !  ? ? ? ?[facility] ?Department Directory ?     ? ? ? ?[email or web address] ? ? ?  ? ? ?[email or web address] ?  ? ?      ?Cardiology ? ?Appointments: ?[place] [phone] ?[place] - [phone]  Endocrinology ? ?Appointments: ?[place] [phone] ?[place] - [phone]  Gastroenterology ? ?Appointments: ?[place] [phone] ?[place] - [phone]  ?      ?General Surgery ? ? ?Appointments: ?[place] [phone]  Internal Medicine/Family Medicine ? ?Appointments: ?[place] [phone] ?[place] [phone] ?[place] - [phone]  Metabolic and Weigh Loss Surgery ? ?Appointments: ?[place] [phone]  ?      ?Neurology ? ?Appointments: ?[place] [phone] ?[place] - [phone]  Neurosurgery ? ?Appointments: ?[place] [phone]  Obstetrics & Gynecology ? ?Appointments: ?[place] [phone] ?[place] - [phone]  ?      ?Pediatrics ? ?Appointments: [place] [phone] ?[place] - [phone]  Physiatry ? ?Appointments: ?[place] [phone]  Physical Therapy ? ?Appointments: ?[place] [phone] ?[place] - [phone]  ?      ?Podiatry ? ?Appointments: ?[place] [phone] ?[place] - [phone]  Pulmonology ? ?Appointments: ?[place] [phone]  Rheumatology ? ?Appointments: ?[place] [phone]  ?      ?[place] Location: ?[facility]  ?[place] ?[place], [place]  [address]  [place] Location: ?[facility] ?[address] ?[place], [place]  [address]  [place] Location: ?[facility] ?[place] ?[place], [place]  [address]  ?  ?

## 2022-02-27 ENCOUNTER — Inpatient Hospital Stay: Admission: RE | Admit: 2022-02-27 | Payer: Medicare PPO | Source: Ambulatory Visit

## 2022-03-10 ENCOUNTER — Ambulatory Visit: Admit: 2022-03-10 | Payer: Medicare PPO | Admitting: Orthopedic Surgery

## 2022-03-10 SURGERY — EXCISION, GANGLION CYST, WRIST
Anesthesia: Choice | Site: Middle Finger | Laterality: Right

## 2022-05-21 ENCOUNTER — Other Ambulatory Visit: Payer: Self-pay | Admitting: Internal Medicine

## 2022-05-21 DIAGNOSIS — Z1231 Encounter for screening mammogram for malignant neoplasm of breast: Secondary | ICD-10-CM

## 2022-07-17 ENCOUNTER — Ambulatory Visit
Admission: RE | Admit: 2022-07-17 | Discharge: 2022-07-17 | Disposition: A | Discharge status: Home / Self Care | Payer: Medicare PPO | Source: Ambulatory Visit | Attending: Internal Medicine | Admitting: Internal Medicine

## 2022-07-17 DIAGNOSIS — Z1231 Encounter for screening mammogram for malignant neoplasm of breast: Secondary | ICD-10-CM | POA: Insufficient documentation

## 2022-07-24 ENCOUNTER — Other Ambulatory Visit: Payer: Self-pay | Admitting: Internal Medicine

## 2022-07-24 DIAGNOSIS — R928 Other abnormal and inconclusive findings on diagnostic imaging of breast: Secondary | ICD-10-CM

## 2022-07-31 ENCOUNTER — Ambulatory Visit: Payer: Medicare PPO

## 2022-07-31 ENCOUNTER — Ambulatory Visit
Admission: RE | Admit: 2022-07-31 | Discharge: 2022-07-31 | Disposition: A | Discharge status: Home / Self Care | Payer: Medicare PPO | Source: Ambulatory Visit | Attending: Internal Medicine | Admitting: Internal Medicine

## 2022-07-31 DIAGNOSIS — R928 Other abnormal and inconclusive findings on diagnostic imaging of breast: Secondary | ICD-10-CM

## 2022-12-15 ENCOUNTER — Other Ambulatory Visit: Payer: Self-pay | Admitting: Internal Medicine

## 2022-12-15 DIAGNOSIS — R1031 Right lower quadrant pain: Secondary | ICD-10-CM

## 2023-01-05 ENCOUNTER — Ambulatory Visit
Admission: RE | Admit: 2023-01-05 | Discharge: 2023-01-05 | Disposition: A | Discharge status: Home / Self Care | Payer: Medicare PPO | Source: Ambulatory Visit | Attending: Internal Medicine | Admitting: Internal Medicine

## 2023-01-05 DIAGNOSIS — R1031 Right lower quadrant pain: Secondary | ICD-10-CM | POA: Insufficient documentation

## 2023-01-05 MED ORDER — IOHEXOL 300 MG/ML  SOLN
100.0000 mL | Freq: Once | INTRAMUSCULAR | Status: AC | PRN
  Administered 2023-01-05: 100 mL via INTRAVENOUS

## 2023-03-25 ENCOUNTER — Ambulatory Visit: Payer: Medicare PPO

## 2023-03-25 DIAGNOSIS — K644 Residual hemorrhoidal skin tags: Secondary | ICD-10-CM

## 2023-03-25 DIAGNOSIS — Z09 Encounter for follow-up examination after completed treatment for conditions other than malignant neoplasm: Secondary | ICD-10-CM | POA: Diagnosis not present

## 2023-03-25 DIAGNOSIS — K589 Irritable bowel syndrome without diarrhea: Secondary | ICD-10-CM

## 2023-03-25 DIAGNOSIS — R197 Diarrhea, unspecified: Secondary | ICD-10-CM | POA: Diagnosis not present

## 2023-03-25 DIAGNOSIS — Z8719 Personal history of other diseases of the digestive system: Secondary | ICD-10-CM | POA: Diagnosis not present

## 2023-03-25 DIAGNOSIS — K5730 Diverticulosis of large intestine without perforation or abscess without bleeding: Secondary | ICD-10-CM | POA: Diagnosis not present

## 2023-05-08 ENCOUNTER — Other Ambulatory Visit: Payer: Self-pay | Admitting: Internal Medicine

## 2023-05-08 DIAGNOSIS — Z1231 Encounter for screening mammogram for malignant neoplasm of breast: Secondary | ICD-10-CM

## 2023-07-23 ENCOUNTER — Other Ambulatory Visit: Payer: Self-pay | Admitting: Family Medicine

## 2023-07-23 DIAGNOSIS — M5412 Radiculopathy, cervical region: Secondary | ICD-10-CM

## 2023-07-24 ENCOUNTER — Ambulatory Visit
Admission: RE | Admit: 2023-07-24 | Discharge: 2023-07-24 | Disposition: A | Discharge status: Home / Self Care | Payer: Medicare PPO | Source: Ambulatory Visit | Attending: Internal Medicine | Admitting: Internal Medicine

## 2023-07-24 DIAGNOSIS — Z1231 Encounter for screening mammogram for malignant neoplasm of breast: Secondary | ICD-10-CM | POA: Diagnosis present

## 2023-08-14 ENCOUNTER — Inpatient Hospital Stay
Admission: RE | Admit: 2023-08-14 | Discharge: 2023-08-14 | Disposition: A | Discharge status: Home / Self Care | Payer: Self-pay | Source: Ambulatory Visit | Attending: Orthopedic Surgery | Admitting: Orthopedic Surgery

## 2023-08-14 ENCOUNTER — Other Ambulatory Visit: Payer: Self-pay | Admitting: Family Medicine

## 2023-08-14 ENCOUNTER — Ambulatory Visit
Admission: RE | Admit: 2023-08-14 | Discharge: 2023-08-14 | Disposition: A | Discharge status: Home / Self Care | Payer: Medicare PPO | Source: Ambulatory Visit | Attending: Family Medicine | Admitting: Family Medicine

## 2023-08-14 DIAGNOSIS — M5412 Radiculopathy, cervical region: Secondary | ICD-10-CM

## 2023-08-14 DIAGNOSIS — Z049 Encounter for examination and observation for unspecified reason: Secondary | ICD-10-CM
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
## 2023-08-31 ENCOUNTER — Ambulatory Visit: Payer: Medicare PPO | Admitting: Orthopedic Surgery
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
## 2023-09-01 ENCOUNTER — Emergency Department: Payer: Medicare PPO

## 2023-09-01 ENCOUNTER — Other Ambulatory Visit: Payer: Self-pay

## 2023-09-01 ENCOUNTER — Emergency Department
Admission: EM | Admit: 2023-09-01 | Discharge: 2023-09-01 | Disposition: A | Discharge status: Home / Self Care | Payer: Medicare PPO | Attending: Emergency Medicine | Admitting: Emergency Medicine

## 2023-09-01 DIAGNOSIS — G8929 Other chronic pain: Secondary | ICD-10-CM | POA: Insufficient documentation

## 2023-09-01 DIAGNOSIS — W010XXA Fall on same level from slipping, tripping and stumbling without subsequent striking against object, initial encounter: Secondary | ICD-10-CM | POA: Diagnosis not present

## 2023-09-01 DIAGNOSIS — M25522 Pain in left elbow: Secondary | ICD-10-CM | POA: Insufficient documentation

## 2023-09-01 DIAGNOSIS — S59902A Unspecified injury of left elbow, initial encounter: Secondary | ICD-10-CM | POA: Diagnosis present

## 2023-09-01 DIAGNOSIS — M542 Cervicalgia: Secondary | ICD-10-CM | POA: Insufficient documentation

## 2023-09-01 DIAGNOSIS — S52125A Nondisplaced fracture of head of left radius, initial encounter for closed fracture: Secondary | ICD-10-CM | POA: Insufficient documentation

## 2023-09-01 MED ORDER — IBUPROFEN 600 MG PO TABS
600.0000 mg | ORAL_TABLET | Freq: Once | ORAL | Status: AC
  Administered 2023-09-01: 600 mg via ORAL
  Filled 2023-09-01: qty 1

## 2023-09-01 MED ORDER — ACETAMINOPHEN 325 MG PO TABS
650.0000 mg | ORAL_TABLET | Freq: Once | ORAL | Status: AC
  Administered 2023-09-01: 650 mg via ORAL
  Filled 2023-09-01: qty 2

## 2023-09-01 NOTE — ED Triage Notes (Signed)
Pt to ED from home [facility] for mechanical fall, tripped walking up steps. Pt caught self with both hands but has pain to L arm, wrist and shoulder. No visible deformity noted but pt holding L arm close to self in position of comfort.
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
## 2023-09-01 NOTE — ED Notes (Signed)
First nurse note: From home [facility] Was stepping up steps, missed step and fell, unsure how landed. Complains of L shoulder pain radiating down L arm Can move arm and fingers, no visible deformities No LOC Hx chronic neck pain  129/88, HR 70, 98% RA Allergy: PCN, codeine

## 2023-09-01 NOTE — ED Notes (Signed)
Asked EDP to order something for pt pain, pt has several allergies, unable to place ED triage pain order set.

## 2023-09-07 ENCOUNTER — Ambulatory Visit: Payer: Medicare PPO | Admitting: Orthopedic Surgery

## 2023-09-07 ENCOUNTER — Encounter: Payer: Self-pay | Admitting: Orthopedic Surgery

## 2023-09-07 VITALS — BP 126/80 | Ht 64.0 in | Wt 158.0 lb

## 2023-09-07 DIAGNOSIS — M47812 Spondylosis without myelopathy or radiculopathy, cervical region: Secondary | ICD-10-CM

## 2023-09-07 DIAGNOSIS — M4802 Spinal stenosis, cervical region: Secondary | ICD-10-CM

## 2023-09-07 DIAGNOSIS — M4312 Spondylolisthesis, cervical region: Secondary | ICD-10-CM

## 2023-09-07 DIAGNOSIS — G8929 Other chronic pain: Secondary | ICD-10-CM | POA: Diagnosis not present

## 2023-09-07 DIAGNOSIS — M5416 Radiculopathy, lumbar region: Secondary | ICD-10-CM | POA: Diagnosis not present

## 2023-09-07 DIAGNOSIS — M4722 Other spondylosis with radiculopathy, cervical region: Secondary | ICD-10-CM

## 2023-09-07 DIAGNOSIS — M50221 Other cervical disc displacement at C4-C5 level: Secondary | ICD-10-CM

## 2023-09-07 DIAGNOSIS — M5030 Other cervical disc degeneration, unspecified cervical region: Secondary | ICD-10-CM

## 2023-09-07 DIAGNOSIS — W19XXXA Unspecified fall, initial encounter: Secondary | ICD-10-CM

## 2023-09-07 DIAGNOSIS — M5412 Radiculopathy, cervical region: Secondary | ICD-10-CM

## 2023-09-07 DIAGNOSIS — M545 Low back pain, unspecified: Secondary | ICD-10-CM

## 2023-09-07 DIAGNOSIS — S52132A Displaced fracture of neck of left radius, initial encounter for closed fracture: Secondary | ICD-10-CM

## 2023-09-07 NOTE — Patient Instructions (Signed)
It was so nice to see you today. Thank you so much for coming in.    You have a lot of wear and tear in your neck with some irritation of the nerves. This is likely what is causing your  neck pain.   You will likely need to do PT prior to any surgery discussion for your neck.   I want to get an MRI of your lower back to look into things further.   I think we need to wait on this until we see what is happening with your elbow.   Call Dr. Joice Lofts (ortho) at the [facility] clinic for follow up for your elbow. His number is [phone].   I will message you next week so we can regroup.   Please do not hesitate to call if you have any questions or concerns. You can also message me in MyChart.   Drake Leach PA-C [phone]     The physicians and staff at [facility] are committed to providing excellent care. You may receive a survey asking for feedback about your experience at our office. We value you your feedback and appreciate you taking the time to to fill it out. The [facility] leadership team is also available to discuss your experience in person, feel free to contact us [phone].

## 2024-06-01 NOTE — Telephone Encounter (Signed)
 Please schedule her a follow up with me in clinic.
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
## 2024-06-15 ENCOUNTER — Ambulatory Visit: Admitting: Orthopedic Surgery

## 2024-06-15 ENCOUNTER — Encounter: Payer: Self-pay | Admitting: Orthopedic Surgery

## 2024-06-15 VITALS — BP 106/68 | Ht 64.0 in | Wt 158.0 lb

## 2024-06-15 DIAGNOSIS — M5021 Other cervical disc displacement,  high cervical region: Secondary | ICD-10-CM | POA: Diagnosis not present

## 2024-06-15 DIAGNOSIS — M5030 Other cervical disc degeneration, unspecified cervical region: Secondary | ICD-10-CM | POA: Diagnosis not present

## 2024-06-15 DIAGNOSIS — G8929 Other chronic pain: Secondary | ICD-10-CM

## 2024-06-15 DIAGNOSIS — M545 Low back pain, unspecified: Secondary | ICD-10-CM

## 2024-06-15 DIAGNOSIS — M79604 Pain in right leg: Secondary | ICD-10-CM

## 2024-06-15 DIAGNOSIS — M4312 Spondylolisthesis, cervical region: Secondary | ICD-10-CM

## 2024-06-15 DIAGNOSIS — M47812 Spondylosis without myelopathy or radiculopathy, cervical region: Secondary | ICD-10-CM | POA: Diagnosis not present

## 2024-06-15 DIAGNOSIS — M4802 Spinal stenosis, cervical region: Secondary | ICD-10-CM

## 2024-06-15 DIAGNOSIS — M5416 Radiculopathy, lumbar region: Secondary | ICD-10-CM

## 2024-06-15 DIAGNOSIS — M5412 Radiculopathy, cervical region: Secondary | ICD-10-CM

## 2024-06-15 NOTE — Patient Instructions (Addendum)
 It was so nice to see you today. Thank you so much for coming in.    You have a lot of wear and tear in your neck with some irritation of the nerves. This is likely what is causing your  neck pain.   You will likely need to do PT prior to any surgery discussion for your neck. We can revisit at your follow up.   I want to get an MRI of your lower back to look into things further. We will get this approved through your insurance and [facility] will call you to schedule the appointment. Ask about your patient responsibility. You do not need to pay this prior to getting MRI, they can bill you.   [facility] (building with the white pillars) is located off of [address]. The address is [address] [place], [place] [address].    After you have the MRI, it can take 14-28 days for me to get the results back. If I don't have them in 2 weeks, we will call to try to get the results.   Once I have the results, we will call you to schedule a follow up visit with me to review them.   Please do not hesitate to call if you have any questions or concerns. You can also message me in MyChart.   Glade Boys PA-C [phone]     The physicians and staff at [facility] at [facility] are committed to providing excellent care. You may receive a survey asking for feedback about your experience at our office. We value you your feedback and appreciate you taking the time to to fill it out. The [facility] leadership team is also available to discuss your experience in person, feel free to contact us  [phone].

## 2024-07-05 ENCOUNTER — Ambulatory Visit
Admission: RE | Admit: 2024-07-05 | Discharge: 2024-07-05 | Disposition: A | Discharge status: Home / Self Care | Source: Ambulatory Visit | Attending: Orthopedic Surgery | Admitting: Orthopedic Surgery

## 2024-07-05 DIAGNOSIS — M5416 Radiculopathy, lumbar region: Secondary | ICD-10-CM | POA: Insufficient documentation

## 2024-07-05 DIAGNOSIS — M5441 Lumbago with sciatica, right side: Secondary | ICD-10-CM | POA: Insufficient documentation

## 2024-07-05 DIAGNOSIS — G8929 Other chronic pain: Secondary | ICD-10-CM | POA: Diagnosis present
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
## 2024-08-02 ENCOUNTER — Ambulatory Visit: Admitting: Orthopedic Surgery

## 2024-08-02 ENCOUNTER — Encounter: Payer: Self-pay | Admitting: Orthopedic Surgery

## 2024-08-02 VITALS — BP 118/80 | Ht 64.0 in | Wt 158.0 lb

## 2024-08-02 DIAGNOSIS — M47816 Spondylosis without myelopathy or radiculopathy, lumbar region: Secondary | ICD-10-CM

## 2024-08-02 DIAGNOSIS — M4316 Spondylolisthesis, lumbar region: Secondary | ICD-10-CM

## 2024-08-02 DIAGNOSIS — M4312 Spondylolisthesis, cervical region: Secondary | ICD-10-CM

## 2024-08-02 DIAGNOSIS — M5416 Radiculopathy, lumbar region: Secondary | ICD-10-CM

## 2024-08-02 DIAGNOSIS — M47812 Spondylosis without myelopathy or radiculopathy, cervical region: Secondary | ICD-10-CM

## 2024-08-02 DIAGNOSIS — M50321 Other cervical disc degeneration at C4-C5 level: Secondary | ICD-10-CM | POA: Diagnosis not present

## 2024-08-02 DIAGNOSIS — M4802 Spinal stenosis, cervical region: Secondary | ICD-10-CM

## 2024-08-02 DIAGNOSIS — M48061 Spinal stenosis, lumbar region without neurogenic claudication: Secondary | ICD-10-CM | POA: Diagnosis not present

## 2024-08-02 DIAGNOSIS — M5412 Radiculopathy, cervical region: Secondary | ICD-10-CM

## 2024-08-02 NOTE — Patient Instructions (Signed)
 It was so nice to see you today. Thank you so much for coming in.    You have wear and tear (arthritis) in your lower back and this is likely causing your back and leg pain.   I ordered xrays of your lower back. You can get these at [facility] (building with the [facility] pillars) off of [address]. The address is [address] [place], [place] [address]. You do not need any appointment. I will message you with results.   I want you to see physical medicine and rehab at the [facility] to discuss possible injections in your lower back. Dr. Avanell, Dr. Dodson, and their PA [place] are great and will take good care of you. They should call you to schedule an appointment or you can call them at [phone].   I will see you back in 8 weeks. Please do not hesitate to call if you have any questions or concerns. You can also message me in MyChart.   Glade Boys PA-C [phone]     The physicians and staff at [facility]surgery at [facility] are committed to providing excellent care. You may receive a survey asking for feedback about your experience at our office. We value you your feedback and appreciate you taking the time to to fill it out. The [facility] leadership team is also available to discuss your experience in person, feel free to contact us  [phone].

## 2024-08-22 ENCOUNTER — Ambulatory Visit: Admitting: Urology

## 2024-09-13 ENCOUNTER — Other Ambulatory Visit: Payer: Self-pay | Admitting: Internal Medicine

## 2024-09-13 DIAGNOSIS — Z1231 Encounter for screening mammogram for malignant neoplasm of breast: Secondary | ICD-10-CM

## 2024-09-23 ENCOUNTER — Telehealth: Payer: Self-pay

## 2024-09-23 NOTE — Telephone Encounter (Signed)
 Patient scheduled to see Stacy on 11/12 after injections with PMR at [facility], do not see that was scheduled or done yet. Left message asking patient to call back to let us  know if she is going to get injections, is so, we can reschedule follow up for after she gets this done unless patient still wants to be seen as scheduled on 09/28/24.

## 2024-09-28 ENCOUNTER — Ambulatory Visit: Admitting: Orthopedic Surgery
